# Patient Record
Sex: Female | Born: 1960
Health system: Southern US, Community
[De-identification: ages and names within clinical notes are randomized; demographics above are authoritative.]

## PROBLEM LIST (undated history)

## (undated) DIAGNOSIS — E785 Hyperlipidemia, unspecified: Secondary | ICD-10-CM

## (undated) DIAGNOSIS — F32A Depression, unspecified: Secondary | ICD-10-CM

## (undated) DIAGNOSIS — R011 Cardiac murmur, unspecified: Secondary | ICD-10-CM

## (undated) DIAGNOSIS — K8020 Calculus of gallbladder without cholecystitis without obstruction: Secondary | ICD-10-CM

## (undated) DIAGNOSIS — F329 Major depressive disorder, single episode, unspecified: Secondary | ICD-10-CM

## (undated) DIAGNOSIS — F419 Anxiety disorder, unspecified: Secondary | ICD-10-CM

## (undated) DIAGNOSIS — E559 Vitamin D deficiency, unspecified: Secondary | ICD-10-CM

## (undated) HISTORY — DX: Anxiety disorder, unspecified: F41.9

## (undated) HISTORY — DX: Hyperlipidemia, unspecified: E78.5

## (undated) HISTORY — DX: Vitamin D deficiency, unspecified: E55.9

## (undated) HISTORY — DX: Cardiac murmur, unspecified: R01.1

## (undated) HISTORY — DX: Calculus of gallbladder without cholecystitis without obstruction: K80.20

## (undated) HISTORY — DX: Major depressive disorder, single episode, unspecified: F32.9

## (undated) HISTORY — PX: OTHER SURGICAL HISTORY: SHX169

## (undated) HISTORY — DX: Depression, unspecified: F32.A

---

## 1998-01-13 ENCOUNTER — Other Ambulatory Visit: Admission: RE | Admit: 1998-01-13 | Discharge: 1998-01-13 | Payer: Self-pay | Admitting: *Deleted

## 1999-02-01 ENCOUNTER — Other Ambulatory Visit: Admission: RE | Admit: 1999-02-01 | Discharge: 1999-02-01 | Payer: Self-pay | Admitting: *Deleted

## 2000-02-22 ENCOUNTER — Other Ambulatory Visit: Admission: RE | Admit: 2000-02-22 | Discharge: 2000-02-22 | Payer: Self-pay | Admitting: *Deleted

## 2001-02-25 ENCOUNTER — Other Ambulatory Visit: Admission: RE | Admit: 2001-02-25 | Discharge: 2001-02-25 | Payer: Self-pay | Admitting: *Deleted

## 2001-12-31 ENCOUNTER — Encounter: Payer: Self-pay | Admitting: General Surgery

## 2001-12-31 ENCOUNTER — Inpatient Hospital Stay (HOSPITAL_COMMUNITY): Admission: AC | Admit: 2001-12-31 | Discharge: 2002-01-02 | Payer: Self-pay

## 2002-01-01 ENCOUNTER — Encounter: Payer: Self-pay | Admitting: General Surgery

## 2005-01-04 ENCOUNTER — Emergency Department (HOSPITAL_COMMUNITY): Admission: EM | Admit: 2005-01-04 | Discharge: 2005-01-05 | Payer: Self-pay | Admitting: Emergency Medicine

## 2005-04-17 ENCOUNTER — Other Ambulatory Visit: Admission: RE | Admit: 2005-04-17 | Discharge: 2005-04-17 | Payer: Self-pay | Admitting: *Deleted

## 2005-08-07 ENCOUNTER — Emergency Department (HOSPITAL_COMMUNITY): Admission: EM | Admit: 2005-08-07 | Discharge: 2005-08-08 | Payer: Self-pay | Admitting: Emergency Medicine

## 2006-04-09 ENCOUNTER — Other Ambulatory Visit: Admission: RE | Admit: 2006-04-09 | Discharge: 2006-04-09 | Payer: Self-pay | Admitting: *Deleted

## 2007-07-01 ENCOUNTER — Other Ambulatory Visit: Admission: RE | Admit: 2007-07-01 | Discharge: 2007-07-01 | Payer: Self-pay | Admitting: *Deleted

## 2007-07-08 ENCOUNTER — Encounter: Admission: RE | Admit: 2007-07-08 | Discharge: 2007-07-08 | Payer: Self-pay | Admitting: *Deleted

## 2008-08-19 ENCOUNTER — Other Ambulatory Visit: Admission: RE | Admit: 2008-08-19 | Discharge: 2008-08-19 | Payer: Self-pay | Admitting: Internal Medicine

## 2009-12-15 ENCOUNTER — Ambulatory Visit (HOSPITAL_COMMUNITY): Admission: RE | Admit: 2009-12-15 | Discharge: 2009-12-15 | Payer: Self-pay | Admitting: Internal Medicine

## 2012-01-29 ENCOUNTER — Other Ambulatory Visit (HOSPITAL_COMMUNITY)
Admission: RE | Admit: 2012-01-29 | Discharge: 2012-01-29 | Disposition: A | Discharge status: Home / Self Care | Payer: BC Managed Care – PPO | Source: Ambulatory Visit | Attending: Internal Medicine | Admitting: Internal Medicine

## 2012-01-29 DIAGNOSIS — Z01419 Encounter for gynecological examination (general) (routine) without abnormal findings: Secondary | ICD-10-CM | POA: Insufficient documentation

## 2012-04-25 ENCOUNTER — Other Ambulatory Visit: Payer: Self-pay | Admitting: Internal Medicine

## 2012-04-25 DIAGNOSIS — Z1231 Encounter for screening mammogram for malignant neoplasm of breast: Secondary | ICD-10-CM

## 2012-05-22 ENCOUNTER — Ambulatory Visit
Admission: RE | Admit: 2012-05-22 | Discharge: 2012-05-22 | Disposition: A | Discharge status: Home / Self Care | Payer: BC Managed Care – PPO | Source: Ambulatory Visit | Attending: Internal Medicine | Admitting: Internal Medicine

## 2012-05-22 DIAGNOSIS — Z1231 Encounter for screening mammogram for malignant neoplasm of breast: Secondary | ICD-10-CM

## 2012-05-22 LAB — HM MAMMOGRAPHY: HM Mammogram: NEGATIVE

## 2012-11-05 ENCOUNTER — Encounter: Payer: Self-pay | Admitting: Internal Medicine

## 2012-11-15 ENCOUNTER — Ambulatory Visit (AMBULATORY_SURGERY_CENTER): Payer: BC Managed Care – PPO | Admitting: *Deleted

## 2012-11-15 VITALS — Ht 62.0 in | Wt 115.6 lb

## 2012-11-15 DIAGNOSIS — Z1211 Encounter for screening for malignant neoplasm of colon: Secondary | ICD-10-CM

## 2012-11-15 MED ORDER — MOVIPREP 100 G PO SOLR: ORAL | Status: DC

## 2012-11-18 ENCOUNTER — Encounter: Payer: Self-pay | Admitting: Internal Medicine

## 2012-11-26 ENCOUNTER — Encounter: Payer: BC Managed Care – PPO | Admitting: Internal Medicine

## 2012-12-24 ENCOUNTER — Encounter: Payer: Self-pay | Admitting: Internal Medicine

## 2012-12-24 ENCOUNTER — Ambulatory Visit (AMBULATORY_SURGERY_CENTER): Payer: BC Managed Care – PPO | Admitting: Internal Medicine

## 2012-12-24 VITALS — BP 122/47 | HR 72 | Temp 98.4°F | Resp 18

## 2012-12-24 DIAGNOSIS — D126 Benign neoplasm of colon, unspecified: Secondary | ICD-10-CM

## 2012-12-24 DIAGNOSIS — Z1211 Encounter for screening for malignant neoplasm of colon: Secondary | ICD-10-CM

## 2012-12-24 MED ORDER — SODIUM CHLORIDE 0.9 % IV SOLN: 500.0000 mL | INTRAVENOUS | Status: DC

## 2012-12-24 NOTE — Progress Notes (Signed)
Lidocaine-40mg IV prior to Propofol InductionPropofol given over incremental dosages 

## 2012-12-24 NOTE — Op Note (Signed)
[facility] [address] [place], [address]   COLONOSCOPY PROCEDURE REPORT  PATIENT: Kayla Ware, Kayla Ware  MR#: [id number] BIRTHDATE: January 20, 1961 , 52  yrs. old GENDER: Female ENDOSCOPIST: Roxy Cedar, MD REFERRED MW:[id number] Oneta Rack, M.D. PROCEDURE DATE:  12/24/2012 PROCEDURE:   Colonoscopy with snare polypectomy   x 1 ASA CLASS:   Class II INDICATIONS:average risk screening. MEDICATIONS: MAC sedation, administered by CRNA and propofol (Diprivan) 200mg  IV  DESCRIPTION OF PROCEDURE:   After the risks benefits and alternatives of the procedure were thoroughly explained, informed consent was obtained.  A digital rectal exam revealed no abnormalities of the rectum.   The LB [id number]  endoscope was introduced through the anus and advanced to the cecum, which was identified by both the appendix and ileocecal valve. No adverse events experienced.   The quality of the prep was excellent, using MoviPrep  The instrument was then slowly withdrawn as the colon was fully examined.      COLON FINDINGS: A diminutive polyp was found in the transverse colon.  A polypectomy was performed with a cold snare.  The resection was complete and the polyp tissue was completely retrieved.   The colon was otherwise normal.  There was no diverticulosis, inflammation, other polyps or cancers.  Retroflexed views revealed no abnormalities. The time to cecum=2 minutes 26 seconds.  Withdrawal time=10 minutes 07 seconds.  The scope was withdrawn and the procedure completed. COMPLICATIONS: There were no complications.  ENDOSCOPIC IMPRESSION: 1.   Diminutive polyp was found in the transverse colon; polypectomy was performed with a cold snare 2.   The colon was otherwise normal  RECOMMENDATIONS: 1. Repeat colonoscopy in 5 years if polyp adenomatous; otherwise 10 years   eSigned:  Roxy Cedar, MD 12/24/2012 8:44 AM   cc: Lucky Cowboy, MD and The Patient

## 2012-12-24 NOTE — Progress Notes (Signed)
Patient did not have preoperative order for IV antibiotic SSI prophylaxis. (G8918)  Patient did not experience any of the following events: a burn prior to discharge; a fall within the facility; wrong site/side/patient/procedure/implant event; or a hospital transfer or hospital admission upon discharge from the facility. (G8907)  

## 2012-12-24 NOTE — Progress Notes (Signed)
Called to room to assist during endoscopic procedure.  Patient ID and intended procedure confirmed with present staff. Received instructions for my participation in the procedure from the performing physician.  

## 2012-12-24 NOTE — Patient Instructions (Addendum)
YOU HAD AN ENDOSCOPIC PROCEDURE TODAY AT THE [facility]: Refer to the procedure report that was given to you for any specific questions about what was found during the examination.  If the procedure report does not answer your questions, please call your gastroenterologist to clarify.  If you requested that your care partner not be given the details of your procedure findings, then the procedure report has been included in a sealed envelope for you to review at your convenience later.  YOU SHOULD EXPECT: Some feelings of bloating in the abdomen. Passage of more gas than usual.  Walking can help get rid of the air that was put into your GI tract during the procedure and reduce the bloating. If you had a lower endoscopy (such as a colonoscopy or flexible sigmoidoscopy) you may notice spotting of blood in your stool or on the toilet paper. If you underwent a bowel prep for your procedure, then you may not have a normal bowel movement for a few days.  DIET: Your first meal following the procedure should be a light meal and then it is ok to progress to your normal diet.  A half-sandwich or bowl of soup is an example of a good first meal.  Heavy or fried foods are harder to digest and may make you feel nauseous or bloated.  Likewise meals heavy in dairy and vegetables can cause extra gas to form and this can also increase the bloating.  Drink plenty of fluids but you should avoid alcoholic beverages for 24 hours.  ACTIVITY: Your care partner should take you home directly after the procedure.  You should plan to take it easy, moving slowly for the rest of the day.  You can resume normal activity the day after the procedure however you should NOT DRIVE or use heavy machinery for 24 hours (because of the sedation medicines used during the test).    SYMPTOMS TO REPORT IMMEDIATELY: A gastroenterologist can be reached at any hour.  During normal business hours, 8:30 AM to 5:00 PM Monday through Friday,  call [phone].  After hours and on weekends, please call the GI answering service at [phone] who will take a message and have the physician on call contact you.   Following lower endoscopy (colonoscopy or flexible sigmoidoscopy):  Excessive amounts of blood in the stool  Significant tenderness or worsening of abdominal pains  Swelling of the abdomen that is new, acute  Fever of 100F or higher  FOLLOW UP: If any biopsies were taken you will be contacted by phone or by letter within the next 1-3 weeks.  Call your gastroenterologist if you have not heard about the biopsies in 3 weeks.  Our staff will call the home number listed on your records the next business day following your procedure to check on you and address any questions or concerns that you may have at that time regarding the information given to you following your procedure. This is a courtesy call and so if there is no answer at the home number and we have not heard from you through the emergency physician on call, we will assume that you have returned to your regular daily activities without incident.  SIGNATURES/CONFIDENTIALITY: You and/or your care partner have signed paperwork which will be entered into your electronic medical record.  These signatures attest to the fact that that the information above on your After Visit Summary has been reviewed and is understood.  Full responsibility of the confidentiality of this   discharge information lies with you and/or your care-partner.  Repeat colonoscopy in 5 years if polyp adenomatous; otherwise 10 years 

## 2012-12-25 ENCOUNTER — Telehealth: Payer: Self-pay | Admitting: *Deleted

## 2012-12-25 NOTE — Telephone Encounter (Signed)
  Follow up Call-  Call back number 12/24/2012  Post procedure Call Back phone  # [phone]  Permission to leave phone message Yes     Patient questions:  Do you have a fever, pain , or abdominal swelling? no Pain Score  0 *  Have you tolerated food without any problems? yes  Have you been able to return to your normal activities? yes  Do you have any questions about your discharge instructions: Diet   no Medications  no Follow up visit  no  Do you have questions or concerns about your Care? no  Actions: * If pain score is 4 or above: No action needed, pain <4.   Follow up Call-  Call back number 12/24/2012  Post procedure Call Back phone  # [phone]  Permission to leave phone message Yes     Patient questions:  Do you have a fever, pain , or abdominal swelling? no Pain Score  0 *  Have you tolerated food without any problems? yes  Have you been able to return to your normal activities? yes  Do you have any questions about your discharge instructions: Diet   no Medications  no Follow up visit  no  Do you have questions or concerns about your Care? no  Actions: * If pain score is 4 or above: No action needed, pain <4.

## 2012-12-25 NOTE — Telephone Encounter (Signed)
No note to be entered 

## 2012-12-30 ENCOUNTER — Encounter: Payer: Self-pay | Admitting: Internal Medicine

## 2013-03-03 ENCOUNTER — Other Ambulatory Visit (HOSPITAL_COMMUNITY): Payer: Self-pay | Admitting: Internal Medicine

## 2013-03-03 ENCOUNTER — Ambulatory Visit (HOSPITAL_COMMUNITY)
Admission: RE | Admit: 2013-03-03 | Discharge: 2013-03-03 | Disposition: A | Discharge status: Home / Self Care | Payer: BC Managed Care – PPO | Source: Ambulatory Visit | Attending: Internal Medicine | Admitting: Internal Medicine

## 2013-03-03 DIAGNOSIS — R0602 Shortness of breath: Secondary | ICD-10-CM | POA: Insufficient documentation

## 2013-03-03 DIAGNOSIS — R911 Solitary pulmonary nodule: Secondary | ICD-10-CM | POA: Insufficient documentation

## 2013-03-03 DIAGNOSIS — R079 Chest pain, unspecified: Secondary | ICD-10-CM

## 2013-03-05 ENCOUNTER — Other Ambulatory Visit: Payer: Self-pay | Admitting: Internal Medicine

## 2013-03-05 DIAGNOSIS — R911 Solitary pulmonary nodule: Secondary | ICD-10-CM

## 2013-03-06 LAB — HM PAP SMEAR

## 2013-03-07 ENCOUNTER — Ambulatory Visit
Admission: RE | Admit: 2013-03-07 | Discharge: 2013-03-07 | Disposition: A | Discharge status: Home / Self Care | Payer: BC Managed Care – PPO | Source: Ambulatory Visit | Attending: Internal Medicine | Admitting: Internal Medicine

## 2013-03-07 DIAGNOSIS — R911 Solitary pulmonary nodule: Secondary | ICD-10-CM

## 2013-03-10 ENCOUNTER — Other Ambulatory Visit: Payer: BC Managed Care – PPO

## 2013-03-25 ENCOUNTER — Other Ambulatory Visit: Payer: Self-pay | Admitting: Internal Medicine

## 2013-03-25 DIAGNOSIS — R109 Unspecified abdominal pain: Secondary | ICD-10-CM

## 2013-03-27 ENCOUNTER — Ambulatory Visit
Admission: RE | Admit: 2013-03-27 | Discharge: 2013-03-27 | Disposition: A | Discharge status: Home / Self Care | Payer: BC Managed Care – PPO | Source: Ambulatory Visit | Attending: Internal Medicine | Admitting: Internal Medicine

## 2013-03-27 DIAGNOSIS — R109 Unspecified abdominal pain: Secondary | ICD-10-CM

## 2013-04-01 ENCOUNTER — Other Ambulatory Visit (HOSPITAL_COMMUNITY): Payer: Self-pay | Admitting: Internal Medicine

## 2013-04-01 DIAGNOSIS — R110 Nausea: Secondary | ICD-10-CM

## 2013-04-01 DIAGNOSIS — R1011 Right upper quadrant pain: Secondary | ICD-10-CM

## 2013-04-01 DIAGNOSIS — K8020 Calculus of gallbladder without cholecystitis without obstruction: Secondary | ICD-10-CM

## 2013-04-04 ENCOUNTER — Encounter (HOSPITAL_COMMUNITY)
Admission: RE | Admit: 2013-04-04 | Discharge: 2013-04-04 | Disposition: A | Discharge status: Home / Self Care | Payer: BC Managed Care – PPO | Source: Ambulatory Visit | Attending: Internal Medicine | Admitting: Internal Medicine

## 2013-04-04 DIAGNOSIS — R1011 Right upper quadrant pain: Secondary | ICD-10-CM

## 2013-04-04 DIAGNOSIS — K8020 Calculus of gallbladder without cholecystitis without obstruction: Secondary | ICD-10-CM

## 2013-04-04 DIAGNOSIS — R110 Nausea: Secondary | ICD-10-CM

## 2013-04-04 MED ORDER — TECHNETIUM TC 99M MEBROFENIN IV KIT
5.0000 | PACK | Freq: Once | INTRAVENOUS | Status: AC | PRN
  Administered 2013-04-04: 5 via INTRAVENOUS

## 2013-05-12 ENCOUNTER — Other Ambulatory Visit: Payer: Self-pay

## 2013-05-12 DIAGNOSIS — Z1231 Encounter for screening mammogram for malignant neoplasm of breast: Secondary | ICD-10-CM

## 2013-06-01 ENCOUNTER — Encounter: Payer: Self-pay | Admitting: Internal Medicine

## 2013-06-01 DIAGNOSIS — E559 Vitamin D deficiency, unspecified: Secondary | ICD-10-CM | POA: Insufficient documentation

## 2013-06-01 DIAGNOSIS — F419 Anxiety disorder, unspecified: Secondary | ICD-10-CM | POA: Insufficient documentation

## 2013-06-01 DIAGNOSIS — E785 Hyperlipidemia, unspecified: Secondary | ICD-10-CM | POA: Insufficient documentation

## 2013-06-02 ENCOUNTER — Ambulatory Visit: Payer: BC Managed Care – PPO | Admitting: Emergency Medicine

## 2013-06-02 ENCOUNTER — Encounter: Payer: Self-pay | Admitting: Emergency Medicine

## 2013-06-02 VITALS — BP 94/62 | HR 82 | Temp 98.6°F | Resp 18 | Wt 110.0 lb

## 2013-06-02 DIAGNOSIS — E782 Mixed hyperlipidemia: Secondary | ICD-10-CM

## 2013-06-02 DIAGNOSIS — F411 Generalized anxiety disorder: Secondary | ICD-10-CM

## 2013-06-02 DIAGNOSIS — R5381 Other malaise: Secondary | ICD-10-CM

## 2013-06-02 DIAGNOSIS — E559 Vitamin D deficiency, unspecified: Secondary | ICD-10-CM

## 2013-06-02 DIAGNOSIS — J019 Acute sinusitis, unspecified: Secondary | ICD-10-CM

## 2013-06-02 LAB — BASIC METABOLIC PANEL WITH GFR
CO2: 26 mEq/L (ref 19–32)
Calcium: 9.2 mg/dL (ref 8.4–10.5)
GFR, Est African American: >89 mL/min
GFR, Est Non African American: >89 mL/min
Sodium: 138 mEq/L (ref 135–145)

## 2013-06-02 LAB — LIPID PANEL
Cholesterol: 198 mg/dL (ref 0–200)
HDL: 58 mg/dL (ref >39)
LDL Cholesterol: 118 mg/dL — ABNORMAL HIGH (ref 0–99)
Total CHOL/HDL Ratio: 3.4 Ratio
Triglycerides: 108 mg/dL (ref <150)
VLDL: 22 mg/dL (ref 0–40)

## 2013-06-02 LAB — CBC WITH DIFFERENTIAL/PLATELET
Basophils Absolute: 0 10*3/uL (ref 0.0–0.1)
Basophils Relative: 0 % (ref 0–1)
Eosinophils Absolute: 0 10*3/uL (ref 0.0–0.7)
Lymphs Abs: 1.3 10*3/uL (ref 0.7–4.0)
MCH: 30.6 pg (ref 26.0–34.0)
MCHC: 34.2 g/dL (ref 30.0–36.0)
Neutrophils Relative %: 73 % (ref 43–77)
Platelets: 210 10*3/uL (ref 150–400)
RBC: 4.71 MIL/uL (ref 3.87–5.11)
RDW: 13.9 % (ref 11.5–15.5)
WBC: 8 10*3/uL (ref 4.0–10.5)

## 2013-06-02 LAB — HEPATIC FUNCTION PANEL
ALT: <8 U/L (ref 0–35)
Bilirubin, Direct: 0.1 mg/dL (ref 0.0–0.3)
Total Bilirubin: 0.7 mg/dL (ref 0.3–1.2)
Total Protein: 7 g/dL (ref 6.0–8.3)

## 2013-06-02 MED ORDER — AMOXICILLIN 500 MG PO TABS: 500.0000 mg | ORAL_TABLET | Freq: Three times a day (TID) | ORAL | Status: DC

## 2013-06-02 MED ORDER — FEXOFENADINE HCL 180 MG PO TABS: 180.0000 mg | ORAL_TABLET | Freq: Every day | ORAL | Status: DC

## 2013-06-02 MED ORDER — ALPRAZOLAM 0.5 MG PO TABS: 0.5000 mg | ORAL_TABLET | Freq: Two times a day (BID) | ORAL | Status: DC

## 2013-06-02 MED ORDER — BENZONATATE 100 MG PO CAPS: 100.0000 mg | ORAL_CAPSULE | Freq: Three times a day (TID) | ORAL | Status: DC | PRN

## 2013-06-02 NOTE — Progress Notes (Addendum)
  Subjective:    Patient ID: Kayla Ware, female    DOB: 10/09/1960, 52 y.o.   MRN: [id number]  HPI Comments: 52 yo female presents for 3 month F/U for  Cholesterol, D. Deficient. She has been trying to improve diet and exercises with work/ yard. She prefers no Cholesterol RX. LAST LABS T 206 TG 137 H 55 LDL 124 She has had sinus congestion over a week now with brown production from nose and occasional cough. + increase with fatigue and feverish since symptoms started. No relief with Tylenol or mucinex.   Hyperlipidemia  Anxiety    Cough Associated symptoms include a fever and postnasal drip.  Sore Throat  Associated symptoms include coughing.    Current Outpatient Prescriptions on File Prior to Visit  Medication Sig Dispense Refill  . B Complex-C (SUPER B COMPLEX PO) Take by mouth daily.      . Cholecalciferol (VITAMIN D3) 3000 UNITS TABS Take by mouth daily.      . clobetasol cream (TEMOVATE) 0.05 % Apply 1 application topically 2 (two) times daily.      . Flaxseed, Linseed, (FLAXSEED OIL PO) Take by mouth daily.      . Red Yeast Rice 600 MG CAPS Take 1,200 mg by mouth.       No current facility-administered medications on file prior to visit.   ALLERGIES Citalopram; Erythromycin; Meloxicam; and Propofol  Past Medical History  Diagnosis Date  . Anxiety   . Heart murmur   . Hyperlipidemia   . Depression   . Gall stones   . Unspecified vitamin D deficiency      Review of Systems  Constitutional: Positive for fever and fatigue.  HENT: Positive for postnasal drip and sinus pressure.   Respiratory: Positive for cough.   All other systems reviewed and are negative.   BP 94/62  Pulse 82  Temp(Src) 98.6 F (37 C) (Oral)  Resp 18  Wt 110 lb (49.896 kg)     Objective:   Physical Exam  Nursing note and vitals reviewed. Constitutional: She appears well-developed and well-nourished.  HENT:  Head: Normocephalic and atraumatic.  Right Ear: External ear normal.   Left Ear: External ear normal.  Nose: Nose normal.  Post pharynx erythema, yellow exudate + maxillary/ frontal tenderness  Eyes: Pupils are equal, round, and reactive to light.  Neck: Normal range of motion. Neck supple.  Cardiovascular: Normal rate, regular rhythm, normal heart sounds and intact distal pulses.   Pulmonary/Chest: Effort normal and breath sounds normal.  Abdominal: Soft.  Musculoskeletal: Normal range of motion.  Lymphadenopathy:    She has cervical adenopathy.  Neurological: She is alert.  Skin: Skin is warm and dry.  Psychiatric: She has a normal mood and affect. Judgment normal.          Assessment & Plan:  1. 3 month F/U for Cholesterol,  D. Deficient check labs 2. Sinusitis- Amox 500 mg AD, Tessalon Perles 100 mg AD, Allegra 180 mg AD, increase H2O w/c if symptoms increase

## 2013-06-02 NOTE — Patient Instructions (Signed)
Fat and Cholesterol Control Diet Fat and cholesterol levels in your blood and organs are influenced by your diet. High levels of fat and cholesterol may lead to diseases of the heart, small and large blood vessels, gallbladder, liver, and pancreas. CONTROLLING FAT AND CHOLESTEROL WITH DIET Although exercise and lifestyle factors are important, your diet is key. That is because certain foods are known to raise cholesterol and others to lower it. The goal is to balance foods for their effect on cholesterol and more importantly, to replace saturated and trans fat with other types of fat, such as monounsaturated fat, polyunsaturated fat, and omega-3 fatty acids. On average, a person should consume no more than 15 to 17 g of saturated fat daily. Saturated and trans fats are considered "bad" fats, and they will raise LDL cholesterol. Saturated fats are primarily found in animal products such as meats, butter, and cream. However, that does not mean you need to give up all your favorite foods. Today, there are good tasting, low-fat, low-cholesterol substitutes for most of the things you like to eat. Choose low-fat or nonfat alternatives. Choose round or loin cuts of red meat. These types of cuts are lowest in fat and cholesterol. Chicken (without the skin), fish, veal, and ground turkey breast are great choices. Eliminate fatty meats, such as hot dogs and salami. Even shellfish have little or no saturated fat. Have a 3 oz (85 g) portion when you eat lean meat, poultry, or fish. Trans fats are also called "partially hydrogenated oils." They are oils that have been scientifically manipulated so that they are solid at room temperature resulting in a longer shelf life and improved taste and texture of foods in which they are added. Trans fats are found in stick margarine, some tub margarines, cookies, crackers, and baked goods.  When baking and cooking, oils are a great substitute for butter. The monounsaturated oils are  especially beneficial since it is believed they lower LDL and raise HDL. The oils you should avoid entirely are saturated tropical oils, such as coconut and palm.  Remember to eat a lot from food groups that are naturally free of saturated and trans fat, including fish, fruit, vegetables, beans, grains (barley, rice, couscous, bulgur wheat), and pasta (without cream sauces).  IDENTIFYING FOODS THAT LOWER FAT AND CHOLESTEROL  Soluble fiber may lower your cholesterol. This type of fiber is found in fruits such as apples, vegetables such as broccoli, potatoes, and carrots, legumes such as beans, peas, and lentils, and grains such as barley. Foods fortified with plant sterols (phytosterol) may also lower cholesterol. You should eat at least 2 g per day of these foods for a cholesterol lowering effect.  Read package labels to identify low-saturated fats, trans fat free, and low-fat foods at the supermarket. Select cheeses that have only 2 to 3 g saturated fat per ounce. Use a heart-healthy tub margarine that is free of trans fats or partially hydrogenated oil. When buying baked goods (cookies, crackers), avoid partially hydrogenated oils. Breads and muffins should be made from whole grains (whole-wheat or whole oat flour, instead of "flour" or "enriched flour"). Buy non-creamy canned soups with reduced salt and no added fats.  FOOD PREPARATION TECHNIQUES  Never deep-fry. If you must fry, either stir-fry, which uses very little fat, or use non-stick cooking sprays. When possible, broil, bake, or roast meats, and steam vegetables. Instead of putting butter or margarine on vegetables, use lemon and herbs, applesauce, and cinnamon (for squash and sweet potatoes). Use nonfat   yogurt, salsa, and low-fat dressings for salads.  LOW-SATURATED FAT / LOW-FAT FOOD SUBSTITUTES Meats / Saturated Fat (g)  Avoid: Steak, marbled (3 oz/85 g) / 11 g  Choose: Steak, lean (3 oz/85 g) / 4 g  Avoid: Hamburger (3 oz/85 g) / 7  g  Choose: Hamburger, lean (3 oz/85 g) / 5 g  Avoid: Ham (3 oz/85 g) / 6 g  Choose: Ham, lean cut (3 oz/85 g) / 2.4 g  Avoid: Chicken, with skin, dark meat (3 oz/85 g) / 4 g  Choose: Chicken, skin removed, dark meat (3 oz/85 g) / 2 g  Avoid: Chicken, with skin, light meat (3 oz/85 g) / 2.5 g  Choose: Chicken, skin removed, light meat (3 oz/85 g) / 1 g Dairy / Saturated Fat (g)  Avoid: Whole milk (1 cup) / 5 g  Choose: Low-fat milk, 2% (1 cup) / 3 g  Choose: Low-fat milk, 1% (1 cup) / 1.5 g  Choose: Skim milk (1 cup) / 0.3 g  Avoid: Hard cheese (1 oz/28 g) / 6 g  Choose: Skim milk cheese (1 oz/28 g) / 2 to 3 g  Avoid: Cottage cheese, 4% fat (1 cup) / 6.5 g  Choose: Low-fat cottage cheese, 1% fat (1 cup) / 1.5 g  Avoid: Ice cream (1 cup) / 9 g  Choose: Sherbet (1 cup) / 2.5 g  Choose: Nonfat frozen yogurt (1 cup) / 0.3 g  Choose: Frozen fruit bar / trace  Avoid: Whipped cream (1 tbs) / 3.5 g  Choose: Nondairy whipped topping (1 tbs) / 1 g Condiments / Saturated Fat (g)  Avoid: Mayonnaise (1 tbs) / 2 g  Choose: Low-fat mayonnaise (1 tbs) / 1 g  Avoid: Butter (1 tbs) / 7 g  Choose: Extra light margarine (1 tbs) / 1 g  Avoid: Coconut oil (1 tbs) / 11.8 g  Choose: Olive oil (1 tbs) / 1.8 g  Choose: Corn oil (1 tbs) / 1.7 g  Choose: Safflower oil (1 tbs) / 1.2 g  Choose: Sunflower oil (1 tbs) / 1.4 g  Choose: Soybean oil (1 tbs) / 2.4 g  Choose: Canola oil (1 tbs) / 1 g Document Released: 07/03/2005 Document Revised: 10/28/2012 Document Reviewed: 12/22/2010 [employer] Patient Information 2014 [place], [place]. Sinusitis Sinusitis is redness, soreness, and puffiness (inflammation) of the air pockets in the bones of your face (sinuses). The redness, soreness, and puffiness can cause air and mucus to get trapped in your sinuses. This can allow germs to grow and cause an infection.  HOME CARE   Drink enough fluids to keep your pee (urine) clear or pale  yellow.  Use a humidifier in your home.  Run a hot shower to create steam in the bathroom. Sit in the bathroom with the door closed. Breathe in the steam 3 4 times a day.  Put a warm, moist washcloth on your face 3 4 times a day, or as told by your doctor.  Use salt water sprays (saline sprays) to wet the thick fluid in your nose. This can help the sinuses drain.  Only take medicine as told by your doctor. GET HELP RIGHT AWAY IF:   Your pain gets worse.  You have very bad headaches.  You are sick to your stomach (nauseous).  You throw up (vomit).  You are very sleepy (drowsy) all the time.  Your face is puffy (swollen).  Your vision changes.  You have a stiff neck.  You have trouble breathing. MAKE  SURE YOU:   Understand these instructions.  Will watch your condition.  Will get help right away if you are not doing well or get worse. Document Released: 12/20/2007 Document Revised: 03/27/2012 Document Reviewed: 02/06/2012 [facility] Patient Information 2014 [place], [place].

## 2013-06-10 ENCOUNTER — Ambulatory Visit
Admission: RE | Admit: 2013-06-10 | Discharge: 2013-06-10 | Disposition: A | Discharge status: Home / Self Care | Payer: BC Managed Care – PPO | Source: Ambulatory Visit

## 2013-06-10 DIAGNOSIS — Z1231 Encounter for screening mammogram for malignant neoplasm of breast: Secondary | ICD-10-CM

## 2013-08-14 ENCOUNTER — Other Ambulatory Visit: Payer: Self-pay | Admitting: Emergency Medicine

## 2013-09-15 ENCOUNTER — Ambulatory Visit (INDEPENDENT_AMBULATORY_CARE_PROVIDER_SITE_OTHER): Payer: BC Managed Care – PPO | Admitting: Emergency Medicine

## 2013-09-15 ENCOUNTER — Encounter: Payer: Self-pay | Admitting: Emergency Medicine

## 2013-09-15 VITALS — BP 104/66 | HR 90 | Temp 98.0°F | Resp 16 | Ht 62.0 in | Wt 112.0 lb

## 2013-09-15 DIAGNOSIS — R232 Flushing: Secondary | ICD-10-CM

## 2013-09-15 DIAGNOSIS — M2669 Other specified disorders of temporomandibular joint: Secondary | ICD-10-CM

## 2013-09-15 DIAGNOSIS — R079 Chest pain, unspecified: Secondary | ICD-10-CM

## 2013-09-15 DIAGNOSIS — M26629 Arthralgia of temporomandibular joint, unspecified side: Secondary | ICD-10-CM

## 2013-09-15 DIAGNOSIS — N951 Menopausal and female climacteric states: Secondary | ICD-10-CM

## 2013-09-15 DIAGNOSIS — F411 Generalized anxiety disorder: Secondary | ICD-10-CM

## 2013-09-15 MED ORDER — CLONIDINE HCL 0.1 MG PO TABS: 0.1000 mg | ORAL_TABLET | Freq: Two times a day (BID) | ORAL | Status: DC

## 2013-09-15 NOTE — Progress Notes (Signed)
Subjective:    Patient ID: Kayla Ware, female    DOB: 03/27/1961, 53 y.o.   MRN: [id number]  HPI Comments: 53 yo female with CP x 3 days. She notes "heart was hurting" but today whole chest felt heavy. She has had increased stress with family and financial situation. She has been using 1/2 xanax to help with hot flashes and anxiety and notes mild relief. She still has some tightness now, but it has improved with rest. She denies radiation of pain and notes it feels tight "not painful". She has not taken an extra ASA  + Hot flashes for several months with multiple trials of medication HRT by GYN w/o relief. She notes they disturb sleep which often increases stress.   + ear pain x  weeks, + clenching of teeth with stress. She also notes mild allergy drainage increase. She has not tried any OTC except for occasional advil with mild relief.  Otalgia   Anxiety Symptoms include chest pain and nervous/anxious behavior.    Chest Pain      Medication List       This list is accurate as of: 09/15/13  3:34 PM.  Always use your most recent med list.               ALPRAZolam 0.5 MG tablet  Commonly known as:  XANAX  TAKE ONE TABLET BY MOUTH TWICE DAILY     fexofenadine 180 MG tablet  Commonly known as:  ALLEGRA  Take 1 tablet (180 mg total) by mouth daily.     FLAXSEED OIL PO  Take by mouth daily.     Red Yeast Rice 600 MG Caps  Take 1,200 mg by mouth.       Allergies  Allergen Reactions  . Citalopram Other (See Comments)    FATIGUE   . Erythromycin Nausea And Vomiting  . Meloxicam Other (See Comments)    GI UPSET  . Propofol Other (See Comments)    TINNITUS   Past Medical History  Diagnosis Date  . Anxiety   . Heart murmur   . Hyperlipidemia   . Depression   . Gall stones   . Unspecified vitamin D deficiency       Review of Systems  Constitutional: Positive for fatigue.  HENT: Positive for ear pain.   Cardiovascular: Positive for chest pain.   Endocrine: Positive for heat intolerance.  Psychiatric/Behavioral: The patient is nervous/anxious.   All other systems reviewed and are negative.   BP 104/66  Pulse 90  Temp(Src) 98 F (36.7 C) (Temporal)  Resp 16  Ht 5\' 2"  (1.575 m)  Wt 112 lb (50.803 kg)  BMI 20.48 kg/m2     Objective:   Physical Exam  Nursing note and vitals reviewed. Constitutional: She is oriented to person, place, and time. She appears well-developed and well-nourished. No distress.  HENT:  Head: Normocephalic and atraumatic.  Right Ear: External ear normal.  Left Ear: External ear normal.  Nose: Nose normal.  Mouth/Throat: Oropharynx is clear and moist. No oropharyngeal exudate.  Cloudy TM's bilaterally +clicking/ popping of Both TMJ with open/ close with mild tenderness  Eyes: Conjunctivae and EOM are normal.  Neck: Normal range of motion. Neck supple. No JVD present. No thyromegaly present.  Cardiovascular: Normal rate, regular rhythm, normal heart sounds and intact distal pulses.   Pulmonary/Chest: Effort normal and breath sounds normal.  Abdominal: Soft. Bowel sounds are normal. She exhibits no distension and no mass. There is no tenderness.  There is no rebound and no guarding.  Musculoskeletal: Normal range of motion. She exhibits no edema and no tenderness.  Lymphadenopathy:    She has no cervical adenopathy.  Neurological: She is alert and oriented to person, place, and time. No cranial nerve deficit.  Skin: Skin is warm and dry. No rash noted. No erythema. No pallor.  Psychiatric: She has a normal mood and affect. Her behavior is normal. Judgment and thought content normal.      EKG NSCSPT WNL    Assessment & Plan:  1. Chest pain vs panic attack- Declines labs and cardio referall at this time. Advised could be stress related but women atypical with CV disease will go to ER if symptoms increase.Advised counseling since declines RX for stress 2. Hot flashes- Clonidine .1 qd to BID start  1/2 qhs and may slowly titrate up PRN 3. Ear pain vs TMJ- ALlegra/ Flonase AD OTC, ADvised caution with anti-inflammatories with past problems with GI upset, Declines PRED, TMJ hygiene explained, w/c with results may need soft diet x 1 week and dental f/u

## 2013-09-15 NOTE — Patient Instructions (Signed)
Chest Pain (Nonspecific) Chest pain has many causes. Your pain could be caused by something serious, such as a heart attack or a blood clot in the lungs. It could also be caused by something less serious, such as a chest bruise or a virus. Follow up with your doctor. More lab tests or other studies may be needed to find the cause of your pain. Most of the time, nonspecific chest pain will improve within 2 to 3 days of rest and mild pain medicine. HOME CARE  For chest bruises, you may put ice on the sore area for 15-20 minutes, 03-04 times a day. Do this only if it makes you feel better.  Put ice in a plastic bag.  Place a towel between the skin and the bag.  Rest for the next 2 to 3 days.  Go back to work if the pain improves.  See your doctor if the pain lasts longer than 1 to 2 weeks.  Only take medicine as told by your doctor.  Quit smoking if you smoke. GET HELP RIGHT AWAY IF:   There is more pain or pain that spreads to the arm, neck, jaw, back, or belly (abdomen).  You have shortness of breath.  You cough more than usual or cough up blood.  You have very bad back or belly pain, feel sick to your stomach (nauseous), or throw up (vomit).  You have very bad weakness.  You pass out (faint).  You have a fever. Any of these problems may be serious and may be an emergency. Do not wait to see if the problems will go away. Get medical help right away. Call your local emergency services 911 in [place] Do not drive yourself to the hospital. MAKE SURE YOU:   Understand these instructions.  Will watch this condition.  Will get help right away if you or your child is not doing well or gets worse. Document Released: 12/20/2007 Document Revised: 09/25/2011 Document Reviewed: 12/20/2007 [facility] Patient Information 2014 [place], [place]. Temporomandibular Problems  Temporomandibular joint (TMJ) dysfunction means there are problems with the joint between your jaw and your skull. This is  a joint lined by cartilage like other joints in your body but also has a small disc in the joint which keeps the bones from rubbing on each other. These joints are like other joints and can get inflamed (sore) from arthritis and other problems. When this joint gets sore, it can cause headaches and pain in the jaw and the face. CAUSES  Usually the arthritic types of problems are caused by soreness in the joint. Soreness in the joint can also be caused by overuse. This may come from grinding your teeth. It may also come from mis-alignment in the joint. DIAGNOSIS Diagnosis of this condition can often be made by history and exam. Sometimes your caregiver may need X-rays or an MRI scan to determine the exact cause. It may be necessary to see your dentist to determine if your teeth and jaws are lined up correctly. TREATMENT  Most of the time this problem is not serious; however, sometimes it can persist (become chronic). When this happens medications that will cut down on inflammation (soreness) help. Sometimes a shot of cortisone into the joint will be helpful. If your teeth are not aligned it may help for your dentist to make a splint for your mouth that can help this problem. If no physical problems can be found, the problem may come from tension. If tension is found to  be the cause, biofeedback or relaxation techniques may be helpful. HOME CARE INSTRUCTIONS   Later in the day, applications of ice packs may be helpful. Ice can be used in a plastic bag with a towel around it to prevent frostbite to skin. This may be used about every 2 hours for 20 to 30 minutes, as needed while awake, or as directed by your caregiver.  Only take over-the-counter or prescription medicines for pain, discomfort, or fever as directed by your caregiver.  If physical therapy was prescribed, follow your caregiver's directions.  Wear mouth appliances as directed if they were given. Document Released: 03/28/2001 Document Revised:  09/25/2011 Document Reviewed: 07/05/2008 [facility] Patient Information 2014 [place], [place].

## 2013-09-22 NOTE — Addendum Note (Signed)
Addended by: Kelby Aline R on: 09/22/2013 08:53 AM   Modules accepted: Orders

## 2013-09-26 ENCOUNTER — Encounter: Payer: Self-pay | Admitting: Emergency Medicine

## 2013-09-26 ENCOUNTER — Other Ambulatory Visit: Payer: Self-pay | Admitting: Emergency Medicine

## 2013-09-26 MED ORDER — VENLAFAXINE HCL ER 75 MG PO CP24: 75.0000 mg | ORAL_CAPSULE | Freq: Every day | ORAL | Status: DC

## 2013-10-27 ENCOUNTER — Encounter: Payer: Self-pay | Admitting: Emergency Medicine

## 2013-10-27 ENCOUNTER — Ambulatory Visit (INDEPENDENT_AMBULATORY_CARE_PROVIDER_SITE_OTHER): Payer: BC Managed Care – PPO | Admitting: Emergency Medicine

## 2013-10-27 VITALS — BP 106/70 | HR 56 | Temp 97.8°F | Resp 16 | Ht 62.0 in | Wt 113.0 lb

## 2013-10-27 DIAGNOSIS — Z79899 Other long term (current) drug therapy: Secondary | ICD-10-CM

## 2013-10-27 DIAGNOSIS — R5381 Other malaise: Secondary | ICD-10-CM

## 2013-10-27 DIAGNOSIS — R5383 Other fatigue: Secondary | ICD-10-CM

## 2013-10-27 DIAGNOSIS — I10 Essential (primary) hypertension: Secondary | ICD-10-CM

## 2013-10-27 DIAGNOSIS — F411 Generalized anxiety disorder: Secondary | ICD-10-CM

## 2013-10-27 DIAGNOSIS — Z0000 Encounter for general adult medical examination without abnormal findings: Secondary | ICD-10-CM

## 2013-10-27 DIAGNOSIS — E559 Vitamin D deficiency, unspecified: Secondary | ICD-10-CM

## 2013-10-27 LAB — BASIC METABOLIC PANEL WITH GFR
BUN: 7 mg/dL (ref 6–23)
CO2: 26 mEq/L (ref 19–32)
CREATININE: 0.59 mg/dL (ref 0.50–1.10)
Calcium: 9.8 mg/dL (ref 8.4–10.5)
Chloride: 105 mEq/L (ref 96–112)
GFR, Est African American: >89 mL/min
Glucose, Bld: 100 mg/dL — ABNORMAL HIGH (ref 70–99)
Potassium: 4.3 mEq/L (ref 3.5–5.3)
Sodium: 140 mEq/L (ref 135–145)

## 2013-10-27 LAB — CBC WITH DIFFERENTIAL/PLATELET
Basophils Absolute: 0 10*3/uL (ref 0.0–0.1)
Basophils Relative: 0 % (ref 0–1)
Eosinophils Absolute: 0.1 10*3/uL (ref 0.0–0.7)
Eosinophils Relative: 2 % (ref 0–5)
HEMATOCRIT: 42.1 % (ref 36.0–46.0)
Hemoglobin: 14.2 g/dL (ref 12.0–15.0)
LYMPHS ABS: 1.8 10*3/uL (ref 0.7–4.0)
LYMPHS PCT: 35 % (ref 12–46)
MCH: 30 pg (ref 26.0–34.0)
MCHC: 33.7 g/dL (ref 30.0–36.0)
MCV: 89 fL (ref 78.0–100.0)
MONO ABS: 0.6 10*3/uL (ref 0.1–1.0)
MONOS PCT: 11 % (ref 3–12)
Neutro Abs: 2.6 10*3/uL (ref 1.7–7.7)
Neutrophils Relative %: 52 % (ref 43–77)
Platelets: 227 10*3/uL (ref 150–400)
RBC: 4.73 MIL/uL (ref 3.87–5.11)
RDW: 14.3 % (ref 11.5–15.5)
WBC: 5 10*3/uL (ref 4.0–10.5)

## 2013-10-27 LAB — HEPATIC FUNCTION PANEL
ALT: 9 U/L (ref 0–35)
AST: 19 U/L (ref 0–37)
Albumin: 4.4 g/dL (ref 3.5–5.2)
Alkaline Phosphatase: 102 U/L (ref 39–117)
BILIRUBIN DIRECT: 0.2 mg/dL (ref 0.0–0.3)
BILIRUBIN TOTAL: 0.9 mg/dL (ref 0.2–1.2)
Indirect Bilirubin: 0.7 mg/dL (ref 0.2–1.2)
Total Protein: 6.8 g/dL (ref 6.0–8.3)

## 2013-10-27 LAB — IRON AND TIBC
%SAT: 47 % (ref 20–55)
IRON: 158 ug/dL — AB (ref 42–145)
TIBC: 334 ug/dL (ref 250–470)
UIBC: 176 ug/dL (ref 125–400)

## 2013-10-27 LAB — HEMOGLOBIN A1C
Hgb A1c MFr Bld: 5.6 % (ref <5.7)
Mean Plasma Glucose: 114 mg/dL (ref <117)

## 2013-10-27 LAB — LIPID PANEL
Cholesterol: 197 mg/dL (ref 0–200)
HDL: 63 mg/dL (ref >39)
LDL Cholesterol: 119 mg/dL — ABNORMAL HIGH (ref 0–99)
TRIGLYCERIDES: 75 mg/dL (ref <150)
Total CHOL/HDL Ratio: 3.1 Ratio
VLDL: 15 mg/dL (ref 0–40)

## 2013-10-27 LAB — MAGNESIUM: MAGNESIUM: 2.2 mg/dL (ref 1.5–2.5)

## 2013-10-27 LAB — VITAMIN B12: VITAMIN B 12: 436 pg/mL (ref 211–911)

## 2013-10-27 LAB — TSH: TSH: 2.302 u[IU]/mL (ref 0.350–4.500)

## 2013-10-27 MED ORDER — ALPRAZOLAM 1 MG PO TABS: 1.0000 mg | ORAL_TABLET | Freq: Every evening | ORAL | Status: DC | PRN

## 2013-10-27 NOTE — Progress Notes (Signed)
Subjective:    Patient ID: Kayla Ware, female    DOB: 08-May-1961, 53 y.o.   MRN: [id number]  HPI Comments: 53 yo WF CPE with cholesterol and anxiety history. She notes she has 3 episodes in last 2 months were feels completely washed out without any other trigger. She notes weakness is most common symptom. She notes she feels like she is anemic or having panic attack when episode happens and occasionally feels throat is closing. She notes mild reflux occasionally.  Her last cycle was over 1 year ago. She still has hot flashes and bad anxiety. She notes she feels occasional head tightness when episodes occur. She has increased stress. She takes 1/2 xanax in a.m, 1/2 lunch/ 1/2 QHS and helps some and she wants to increase dose. She notes she is working a lot and has leg/ back pain with increased hours at work. She notes pain resolves with rest.  Last abnormal lab was LDL 118  She is not exercising routinely but stays active with multiple jobs. She eats descent but occasionally skips meals.   Anxiety Symptoms include nervous/anxious behavior.    Hyperlipidemia     Medication List       This list is accurate as of: 10/27/13 11:59 PM.  Always use your most recent med list.               ALPRAZolam 1 MG tablet  Commonly known as:  XANAX  Take 1 tablet (1 mg total) by mouth at bedtime as needed for sleep.     aspirin 81 MG tablet  Take 81 mg by mouth daily.     fexofenadine 180 MG tablet  Commonly known as:  ALLEGRA  Take 1 tablet (180 mg total) by mouth daily.     FLAXSEED OIL PO  Take by mouth daily.     Red Yeast Rice 600 MG Caps  Take 1,200 mg by mouth.     VITAMIN-B COMPLEX PO  Take by mouth daily.        Allergies  Allergen Reactions  . Citalopram Other (See Comments)    FATIGUE   . Erythromycin Nausea And Vomiting  . Meloxicam Other (See Comments)    GI UPSET  . Propofol Other (See Comments)    TINNITUS   Past Medical History  Diagnosis Date  .  Anxiety   . Heart murmur   . Hyperlipidemia   . Depression   . Gall stones   . Unspecified vitamin D deficiency    Past Surgical History  Procedure Laterality Date  . None     History  Substance Use Topics  . Smoking status: Never Smoker   . Smokeless tobacco: Never Used  . Alcohol Use: 3.6 oz/week    6 Cans of beer per week   Family History  Problem Relation Age of Onset  . Colon polyps Father   . Hyperlipidemia Father   . Depression Father   . Colon cancer Paternal Aunt 75  . Arthritis Brother     RHEUMATOID  . Cancer Maternal Aunt     breast, cervical  . Cancer Maternal Grandmother   . Birth defects Maternal Grandmother     breast  . Diabetes Paternal Grandmother    Patient Care Team: Unk Pinto, MD as PCP - General (Internal Medicine) Jerrell Belfast, MD as Consulting Physician (Otolaryngology) Cyril Mourning, MD as Consulting Physician (Obstetrics and Gynecology) Judson Roch, (Dentist) [facility] [address], (Eye)  MAINTENANCE: Colonoscopy: 12/24/12 Mammo:06/10/13 BMD:n/a Pap/  Pelvic:03/06/13 [id number] Dentist:q 6 month  IMMUNIZATIONS: Tdap: 2014 Pneumovax:n/a Zostavax:n/a Influenza:n/a    Review of Systems  Constitutional: Positive for fatigue.  HENT: Positive for trouble swallowing.   Neurological: Positive for headaches.  Psychiatric/Behavioral: The patient is nervous/anxious.   All other systems reviewed and are negative.  BP 106/70  Pulse 56  Temp(Src) 97.8 F (36.6 C) (Temporal)  Resp 16  Ht 5\' 2"  (1.575 m)  Wt 113 lb (51.256 kg)  BMI 20.66 kg/m2     Objective:   Physical Exam  Nursing note and vitals reviewed. Constitutional: She is oriented to person, place, and time. She appears well-developed and well-nourished. No distress.  HENT:  Head: Normocephalic and atraumatic.  Right Ear: External ear normal.  Left Ear: External ear normal.  Nose: Nose normal.  Mouth/Throat: Oropharynx is clear and moist. No oropharyngeal exudate.   Eyes: Conjunctivae and EOM are normal. Pupils are equal, round, and reactive to light. Right eye exhibits no discharge. Left eye exhibits no discharge. No scleral icterus.  Neck: Normal range of motion. Neck supple. No JVD present. No tracheal deviation present. No thyromegaly present.  Cardiovascular: Normal rate, regular rhythm, normal heart sounds and intact distal pulses.   Pulmonary/Chest: Effort normal and breath sounds normal.  Abdominal: Soft. Bowel sounds are normal. She exhibits no distension and no mass. There is no tenderness. There is no rebound and no guarding.  Genitourinary:  DEF GYN  Musculoskeletal: Normal range of motion. She exhibits no edema and no tenderness.  Lymphadenopathy:    She has no cervical adenopathy.  Neurological: She is alert and oriented to person, place, and time. She has normal reflexes. No cranial nerve deficit. She exhibits normal muscle tone. Coordination normal.  Skin: Skin is warm and dry. No rash noted. No erythema. No pallor.  Left low abdomen flat 2 mm dark  Psychiatric: She has a normal mood and affect. Her behavior is normal. Judgment and thought content normal.      AORTA SCAN WNL EKG NSCSPT     Assessment & Plan:  1. CPE- Update screening labs/ History/ Immunizations/ Testing as needed. Advised healthy diet, QD exercise, increase H20 and continue RX/ Vitamins AD.  2. ? Headache vs menopause vs chronic fatigue-? CT HEAD if no change- ? Tension HA vs Anxiety, advised decrease stress, check labs. Advised may increase Xanax to 1 mg 1/2 to 1 BID, w/c with results or if SX increase or ER.   3. ? GERD vs anxiety vs Allergic rhinitis- Allegra OTC, increase H2o, allergy hygiene explained.- Diet/ hygiene discussed, May try OTC Nexium/ Prilosec and call with results  4.  Irreg Nevi- monitor for any change, call if occurs for removal

## 2013-10-27 NOTE — Patient Instructions (Addendum)
Diet for Gastroesophageal Reflux Disease, Adult Reflux (acid reflux) is when acid from your stomach flows up into the esophagus. When acid comes in contact with the esophagus, the acid causes irritation and soreness (inflammation) in the esophagus. When reflux happens often or so severely that it causes damage to the esophagus, it is called gastroesophageal reflux disease (GERD). Nutrition therapy can help ease the discomfort of GERD. FOODS OR DRINKS TO AVOID OR LIMIT  Smoking or chewing tobacco. Nicotine is one of the most potent stimulants to acid production in the gastrointestinal tract.  Caffeinated and decaffeinated coffee and black tea.  Regular or low-calorie carbonated beverages or energy drinks (caffeine-free carbonated beverages are allowed).   Strong spices, such as black pepper, white pepper, red pepper, cayenne, curry powder, and chili powder.  Peppermint or spearmint.  Chocolate.  High-fat foods, including meats and fried foods. Extra added fats including oils, butter, salad dressings, and nuts. Limit these to less than 8 tsp per day.  Fruits and vegetables if they are not tolerated, such as citrus fruits or tomatoes.  Alcohol.  Any food that seems to aggravate your condition. If you have questions regarding your diet, call your caregiver or a registered dietitian. OTHER THINGS THAT MAY HELP GERD INCLUDE:   Eating your meals slowly, in a relaxed setting.  Eating 5 to 6 small meals per day instead of 3 large meals.  Eliminating food for a period of time if it causes distress.  Not lying down until 3 hours after eating a meal.  Keeping the head of your bed raised 6 to 9 inches (15 to 23 cm) by using a foam wedge or blocks under the legs of the bed. Lying flat may make symptoms worse.  Being physically active. Weight loss may be helpful in reducing reflux in overweight or obese adults.  Wear loose fitting clothing EXAMPLE MEAL PLAN This meal plan is approximately  2,000 calories based on [email or web address] meal planning guidelines. Breakfast   cup cooked oatmeal.  1 cup strawberries.  1 cup low-fat milk.  1 oz almonds. Snack  1 cup cucumber slices.  6 oz yogurt (made from low-fat or fat-free milk). Lunch  2 slice whole-wheat bread.  2 oz sliced [place].  2 tsp mayonnaise.  1 cup blueberries.  1 cup snap peas. Snack  6 whole-wheat crackers.  1 oz string cheese. Dinner   cup brown rice.  1 cup mixed veggies.  1 tsp olive oil.  3 oz grilled fish. Document Released: 07/03/2005 Document Revised: 09/25/2011 Document Reviewed: 05/19/2011 [facility] Patient Information 2014 [place], [place]. Tension Headache A tension headache is pain, pressure, or aching felt over the front and sides of the head. Tension headaches often come after stress, feeling worried (anxiety), or feeling sad or down for a while (depressed). HOME CARE  Only take medicine as told by your doctor.  Lie down in a dark, quiet room when you have a headache.  Keep a journal to find out if certain things bring on headaches. For example, write down:  What you eat and drink.  How much sleep you get.  Any change to your diet or medicines.  Relax by getting a massage or doing other relaxing activities.  Put ice or heat packs on the head and neck area as told by your doctor.  Lessen stress.  Sit up straight. Do not tighten (tense) your muscles.  Quit smoking if you smoke.  Lessen how much alcohol you drink.  Lessen how much caffeine you drink,  or stop drinking caffeine.  Eat and exercise regularly.  Get enough sleep.  Avoid using too much pain medicine. GET HELP RIGHT AWAY IF:   Your headache becomes really bad.  You have a fever.  You have a stiff neck.  You have trouble seeing.  Your muscles are weak, or you lose muscle control.  You lose your balance or have trouble walking.  You feel like you will pass out (faint), or you pass  out.  You have really bad symptoms that are different than your first symptoms.  You have problems with the medicines given to you by your doctor.  Your medicines do not work.  Your headache feels different than the other headaches.  You feel sick to your stomach (nauseous) or throw up (vomit). MAKE SURE YOU:   Understand these instructions.  Will watch your condition.  Will get help right away if you are not doing well or get worse. Document Released: 09/27/2009 Document Revised: 09/25/2011 Document Reviewed: 06/23/2011 [facility] Patient Information 2014 [place], [place].

## 2013-10-28 LAB — URINALYSIS, ROUTINE W REFLEX MICROSCOPIC
BILIRUBIN URINE: NEGATIVE
GLUCOSE, UA: NEGATIVE mg/dL
Hgb urine dipstick: NEGATIVE
Ketones, ur: NEGATIVE mg/dL
Leukocytes, UA: NEGATIVE
Nitrite: NEGATIVE
PROTEIN: NEGATIVE mg/dL
Specific Gravity, Urine: 1.01 (ref 1.005–1.030)
Urobilinogen, UA: 0.2 mg/dL (ref 0.0–1.0)
pH: 7 (ref 5.0–8.0)

## 2013-10-28 LAB — MICROALBUMIN / CREATININE URINE RATIO
Creatinine, Urine: 62.6 mg/dL
MICROALB UR: 0.5 mg/dL (ref 0.00–1.89)
MICROALB/CREAT RATIO: 8 mg/g (ref 0.0–30.0)

## 2013-10-28 LAB — FOLATE RBC: RBC Folate: 529 ng/mL (ref >280)

## 2013-10-28 LAB — INSULIN, FASTING: Insulin fasting, serum: 8 u[IU]/mL (ref 3–28)

## 2013-10-28 LAB — VITAMIN D 25 HYDROXY (VIT D DEFICIENCY, FRACTURES): Vit D, 25-Hydroxy: 35 ng/mL (ref 30–89)

## 2014-01-07 ENCOUNTER — Other Ambulatory Visit: Payer: Self-pay

## 2014-01-07 ENCOUNTER — Other Ambulatory Visit: Payer: Self-pay | Admitting: Emergency Medicine

## 2014-01-07 DIAGNOSIS — N390 Urinary tract infection, site not specified: Secondary | ICD-10-CM

## 2014-01-08 ENCOUNTER — Other Ambulatory Visit: Payer: Self-pay | Admitting: Emergency Medicine

## 2014-01-08 LAB — URINALYSIS, ROUTINE W REFLEX MICROSCOPIC
Bilirubin Urine: NEGATIVE
Glucose, UA: NEGATIVE mg/dL
Hgb urine dipstick: NEGATIVE
Leukocytes, UA: NEGATIVE
Nitrite: NEGATIVE
PH: 5 (ref 5.0–8.0)
Protein, ur: NEGATIVE mg/dL
SPECIFIC GRAVITY, URINE: 1.025 (ref 1.005–1.030)
Urobilinogen, UA: 0.2 mg/dL (ref 0.0–1.0)

## 2014-01-08 LAB — URINE CULTURE: Colony Count: 50000

## 2014-01-08 MED ORDER — ALPRAZOLAM 1 MG PO TABS: 1.0000 mg | ORAL_TABLET | Freq: Two times a day (BID) | ORAL | Status: DC | PRN

## 2014-03-02 ENCOUNTER — Ambulatory Visit: Payer: Self-pay | Admitting: Emergency Medicine

## 2014-03-16 ENCOUNTER — Ambulatory Visit (INDEPENDENT_AMBULATORY_CARE_PROVIDER_SITE_OTHER): Payer: BC Managed Care – PPO | Admitting: Emergency Medicine

## 2014-03-16 ENCOUNTER — Encounter: Payer: Self-pay | Admitting: Emergency Medicine

## 2014-03-16 VITALS — BP 122/80 | HR 62 | Temp 98.0°F | Resp 16 | Ht 62.0 in | Wt 114.0 lb

## 2014-03-16 DIAGNOSIS — R5383 Other fatigue: Secondary | ICD-10-CM

## 2014-03-16 DIAGNOSIS — R5381 Other malaise: Secondary | ICD-10-CM

## 2014-03-16 DIAGNOSIS — M2550 Pain in unspecified joint: Secondary | ICD-10-CM

## 2014-03-16 DIAGNOSIS — R7309 Other abnormal glucose: Secondary | ICD-10-CM

## 2014-03-16 DIAGNOSIS — R413 Other amnesia: Secondary | ICD-10-CM

## 2014-03-16 DIAGNOSIS — E782 Mixed hyperlipidemia: Secondary | ICD-10-CM

## 2014-03-16 LAB — CBC WITH DIFFERENTIAL/PLATELET
Basophils Absolute: 0 10*3/uL (ref 0.0–0.1)
Basophils Relative: 0 % (ref 0–1)
EOS ABS: 0.1 10*3/uL (ref 0.0–0.7)
EOS PCT: 2 % (ref 0–5)
HCT: 41 % (ref 36.0–46.0)
Hemoglobin: 13.5 g/dL (ref 12.0–15.0)
Lymphocytes Relative: 33 % (ref 12–46)
Lymphs Abs: 2.4 10*3/uL (ref 0.7–4.0)
MCH: 29.7 pg (ref 26.0–34.0)
MCHC: 32.9 g/dL (ref 30.0–36.0)
MCV: 90.3 fL (ref 78.0–100.0)
Monocytes Absolute: 0.6 10*3/uL (ref 0.1–1.0)
Monocytes Relative: 8 % (ref 3–12)
NEUTROS PCT: 57 % (ref 43–77)
Neutro Abs: 4.1 10*3/uL (ref 1.7–7.7)
PLATELETS: 214 10*3/uL (ref 150–400)
RBC: 4.54 MIL/uL (ref 3.87–5.11)
RDW: 14.1 % (ref 11.5–15.5)
WBC: 7.2 10*3/uL (ref 4.0–10.5)

## 2014-03-16 NOTE — Patient Instructions (Signed)
FYI  Dementia Dementia is a general term for problems with brain function. A person with dementia has memory loss and a hard time with at least one other brain function such as thinking, speaking, or problem solving. Dementia can affect social functioning, how you do your job, your mood, or your personality. The changes may be hidden for a long time. The earliest forms of this disease are usually not detected by family or friends. Dementia can be:  Irreversible.  Potentially reversible.  Partially reversible.  Progressive. This means it can get worse over time. CAUSES  Irreversible dementia causes may include:  Degeneration of brain cells (Alzheimer disease or Lewy body dementia).  Multiple small strokes (vascular dementia).  Infection (chronic meningitis or Creutzfeldt-Jakob disease).  Frontotemporal dementia. This affects younger people, age 26 to 82, compared to those who have Alzheimer disease.  Dementia associated with other disorders like Parkinson disease, Huntington disease, or HIV-associated dementia. Potentially or partially reversible dementia causes may include:  Medicines.  Metabolic causes such as excessive alcohol intake, vitamin B12 deficiency, or thyroid disease.  Masses or pressure in the brain such as a tumor, blood clot, or hydrocephalus. SIGNS AND SYMPTOMS  Symptoms are often hard to detect. Family members or coworkers may not notice them early in the disease process. Different people with dementia may have different symptoms. Symptoms can include:  A hard time with memory, especially recent memory. Long-term memory may not be impaired.  Asking the same question multiple times or forgetting something someone just said.  A hard time speaking your thoughts or finding certain words.  A hard time solving problems or performing familiar tasks (such as how to use a telephone).  Sudden changes in mood.  Changes in personality, especially increasing moodiness  or mistrust.  Depression.  A hard time understanding complex ideas that were never a problem in the past. DIAGNOSIS  There are no specific tests for dementia.   Your health care provider may recommend a thorough evaluation. This is because some forms of dementia can be reversible. The evaluation will likely include a physical exam and getting a detailed history from you and a family member. The history often gives the best clues and suggestions for a diagnosis.  Memory testing may be done. A detailed brain function evaluation called neuropsychologic testing may be helpful.  Lab tests and brain imaging (such as a CT scan or MRI scan) are sometimes important.  Sometimes observation and re-evaluation over time is very helpful. TREATMENT  Treatment depends on the cause.   If the problem is a vitamin deficiency, it may be helped or cured with supplements.  For dementias such as Alzheimer disease, medicines are available to stabilize or slow the course of the disease. There are no cures for this type of dementia.  Your health care provider can help direct you to groups, organizations, and other health care providers to help with decisions in the care of you or your loved one. HOME CARE INSTRUCTIONS The care of individuals with dementia is varied and dependent upon the progression of the dementia. The following suggestions are intended for the person living with, or caring for, the person with dementia.  Create a safe environment.  Remove the locks on bathroom doors to prevent the person from accidentally locking himself or herself in.  Use childproof latches on kitchen cabinets and any place where cleaning supplies, chemicals, or alcohol are kept.  Use childproof covers in unused electrical outlets.  Install childproof devices to keep doors  and windows secured.  Remove stove knobs or install safety knobs and an automatic shut-off on the stove.  Lower the temperature on water  heaters.  Label medicines and keep them locked up.  Secure knives, lighters, matches, power tools, and guns, and keep these items out of reach.  Keep the house free from clutter. Remove rugs or anything that might contribute to a fall.  Remove objects that might break and hurt the person.  Make sure lighting is good, both inside and outside.  Install grab rails as needed.  Use a monitoring device to alert you to falls or other needs for help.  Reduce confusion.  Keep familiar objects and people around.  Use night lights or dim lights at night.  Label items or areas.  Use reminders, notes, or directions for daily activities or tasks.  Keep a simple, consistent routine for waking, meals, bathing, dressing, and bedtime.  Create a calm, quiet environment.  Place large clocks and calendars prominently.  Display emergency numbers and home address near all telephones.  Use cues to establish different times of the day. An example is to open curtains to let the natural light in during the day.   Use effective communication.  Choose simple words and short sentences.  Use a gentle, calm tone of voice.  Be careful not to interrupt.  If the person is struggling to find a word or communicate a thought, try to provide the word or thought.  Ask one question at a time. Allow the person ample time to answer questions. Repeat the question again if the person does not respond.  Reduce nighttime restlessness.  Provide a comfortable bed.  Have a consistent nighttime routine.  Ensure a regular walking or physical activity schedule. Involve the person in daily activities as much as possible.  Limit napping during the day.  Limit caffeine.  Attend social events that stimulate rather than overwhelm the senses.  Encourage good nutrition and hydration.  Reduce distractions during meal times and snacks.  Avoid foods that are too hot or too cold.  Monitor chewing and swallowing  ability.  Continue with routine vision, hearing, dental, and medical screenings.  Give medicines only as directed by the health care provider.  Monitor driving abilities. Do not allow the person to drive when safe driving is no longer possible.  Register with an identification program which could provide location assistance in the event of a missing person situation. SEEK MEDICAL CARE IF:   New behavioral problems start such as moodiness, aggressiveness, or seeing things that are not there (hallucinations).  Any new problem with brain function happens. This includes problems with balance, speech, or falling a lot.  Problems with swallowing develop.  Any symptoms of other illness happen. Small changes or worsening in any aspect of brain function can be a sign that the illness is getting worse. It can also be a sign of another medical illness such as infection. Seeing a health care provider right away is important. SEEK IMMEDIATE MEDICAL CARE IF:   A fever develops.  New or worsened confusion develops.  New or worsened sleepiness develops.  Staying awake becomes hard to do. Document Released: 12/27/2000 Document Revised: 11/17/2013 Document Reviewed: 11/28/2010 [facility] Patient Information 2015 [place], [place]. This information is not intended to replace advice given to you by your health care provider. Make sure you discuss any questions you have with your health care provider.

## 2014-03-16 NOTE — Progress Notes (Signed)
Subjective:    Patient ID: Kayla Ware, female    DOB: 02-24-1961, 53 y.o.   MRN: [id number]  HPI Comments: 53 yo WF presents for 3 month F/U for Cholesterol, Pre-Dm, D. Deficient. She denies exercise outside of standing at work. She is eating healthy for most part.   She has noticed increased hand arthritis again, she had 1.5 years relief after injection by Ortho with neg RA/ autoimmune 2/14. She notes brother has + RA and is being treated.   She is also concerned with memory issues. She notes she is forgetting simple things more often. She had CT head 2007 with mild atrophy related to ETOH. She drinks 3-5 beers most nights.  WBC             5.0   10/27/2013 HGB            14.2   10/27/2013 HCT            42.1   10/27/2013 PLT             227   10/27/2013 GLUCOSE         100   10/27/2013 CHOL            197   4/13/2015ha TRIG             75   10/27/2013 HDL              63   10/27/2013 LDLCALC         119   10/27/2013 ALT               9   10/27/2013 AST              19   10/27/2013 NA              140   10/27/2013 K               4.3   10/27/2013 CL              105   10/27/2013 CREATININE     0.59   10/27/2013 BUN               7   10/27/2013 CO2              26   10/27/2013 TSH           2.302   10/27/2013 HGBA1C          5.6   10/27/2013 MICROALBUR     0.50   10/27/2013   Hyperlipidemia     Medication List       This list is accurate as of: 03/16/14  3:16 PM.  Always use your most recent med list.               ALPRAZolam 1 MG tablet  Commonly known as:  XANAX  Take 1 tablet (1 mg total) by mouth 2 (two) times daily as needed for sleep.     aspirin 81 MG tablet  Take 81 mg by mouth daily.     FLAXSEED OIL PO  Take by mouth daily.     Red Yeast Rice 600 MG Caps  Take 1,200 mg by mouth.     VITAMIN-B COMPLEX PO  Take by mouth daily.       Allergies  Allergen Reactions  . Citalopram Other (See Comments)    FATIGUE   . Erythromycin Nausea And Vomiting  .  Meloxicam Other (See Comments)    GI UPSET  . Propofol Other (See Comments)    TINNITUS   Past Medical History  Diagnosis Date  . Anxiety   . Heart murmur   . Hyperlipidemia   . Depression   . Gall stones   . Unspecified vitamin D deficiency    Past Surgical History  Procedure Laterality Date  . None      History  Substance Use Topics  . Smoking status: Never Smoker   . Smokeless tobacco: Never Used  . Alcohol Use: 3.6 oz/week    6 Cans of beer per week   Family History  Problem Relation Age of Onset  . Colon polyps Father   . Hyperlipidemia Father   . Depression Father   . Colon cancer Paternal Aunt 66  . Arthritis Brother     RHEUMATOID  . Cancer Maternal Aunt     breast, cervical  . Cancer Maternal Grandmother   . Birth defects Maternal Grandmother     breast  . Diabetes Paternal Grandmother    MAINTENANCE: Colonoscopy:12/24/12 Mammo:06/10/13 BMD:n/a Pap/ Pelvic:01/2012 MWU:[id number] Dentist:q 6 month  IMMUNIZATIONS: Tdap:10/28/12  Patient Care Team: Unk Pinto, MD as PCP - General (Internal Medicine) Jerrell Belfast, MD as Consulting Physician (Otolaryngology) Cyril Mourning, MD as Consulting Physician (Obstetrics and Gynecology)     Review of Systems  Musculoskeletal: Positive for arthralgias.  Psychiatric/Behavioral: Positive for confusion.  All other systems reviewed and are negative.  BP 122/80  Pulse 62  Temp(Src) 98 F (36.7 C) (Temporal)  Resp 16  Ht 5\' 2"  (1.575 m)  Wt 114 lb (51.71 kg)  BMI 20.85 kg/m2     Objective:   Physical Exam  Nursing note and vitals reviewed. Constitutional: She is oriented to person, place, and time. She appears well-developed and well-nourished. No distress.  HENT:  Head: Normocephalic and atraumatic.  Right Ear: External ear normal.  Left Ear: External ear normal.  Nose: Nose normal.  Mouth/Throat: Oropharynx is clear and moist.  Eyes: Conjunctivae and EOM are normal.  Neck: Normal range  of motion. Neck supple. No JVD present. No thyromegaly present.  Cardiovascular: Normal rate, regular rhythm, normal heart sounds and intact distal pulses.   Pulmonary/Chest: Effort normal and breath sounds normal.  Abdominal: Soft. Bowel sounds are normal. She exhibits no distension and no mass. There is no tenderness. There is no rebound and no guarding.  Musculoskeletal: Normal range of motion. She exhibits no edema and no tenderness.  Lymphadenopathy:    She has no cervical adenopathy.  Neurological: She is alert and oriented to person, place, and time. No cranial nerve deficit. Coordination normal.  Skin: Skin is warm and dry. No rash noted. No erythema. No pallor.  Psychiatric: She has a normal mood and affect. Her behavior is normal. Judgment and thought content normal.          Assessment & Plan:  1.  3 month F/U for Cholesterol, Pre-Dm, D. Deficient. Needs healthy diet, cardio QD and obtain healthy weight. Check Labs, Check BP if >130/80 call office   2. Arthralgia- ref Rheum with family history and increased pain  3. Memory change vs depression vs Fatigue- check labs, increase activity and H2O, MRI brain w, w/o to further evaluate. Advised decrease ETOH.

## 2014-03-17 LAB — HEPATIC FUNCTION PANEL
ALT: 10 U/L (ref 0–35)
AST: 16 U/L (ref 0–37)
Albumin: 4.4 g/dL (ref 3.5–5.2)
Alkaline Phosphatase: 109 U/L (ref 39–117)
BILIRUBIN DIRECT: 0.1 mg/dL (ref 0.0–0.3)
BILIRUBIN TOTAL: 0.5 mg/dL (ref 0.2–1.2)
Indirect Bilirubin: 0.4 mg/dL (ref 0.2–1.2)
Total Protein: 6.6 g/dL (ref 6.0–8.3)

## 2014-03-17 LAB — BASIC METABOLIC PANEL WITH GFR
BUN: 11 mg/dL (ref 6–23)
CALCIUM: 9.2 mg/dL (ref 8.4–10.5)
CO2: 28 mEq/L (ref 19–32)
Chloride: 106 mEq/L (ref 96–112)
Creat: 0.64 mg/dL (ref 0.50–1.10)
Glucose, Bld: 94 mg/dL (ref 70–99)
Potassium: 3.8 mEq/L (ref 3.5–5.3)
Sodium: 141 mEq/L (ref 135–145)

## 2014-03-17 LAB — LIPID PANEL
CHOL/HDL RATIO: 3.3 ratio
CHOLESTEROL: 179 mg/dL (ref 0–200)
HDL: 55 mg/dL (ref >39)
LDL CALC: 94 mg/dL (ref 0–99)
TRIGLYCERIDES: 149 mg/dL (ref <150)
VLDL: 30 mg/dL (ref 0–40)

## 2014-03-17 LAB — HEMOGLOBIN A1C
HEMOGLOBIN A1C: 5.4 % (ref <5.7)
Mean Plasma Glucose: 108 mg/dL (ref <117)

## 2014-03-17 LAB — TSH: TSH: 2.14 u[IU]/mL (ref 0.350–4.500)

## 2014-03-20 ENCOUNTER — Other Ambulatory Visit: Payer: BC Managed Care – PPO

## 2014-03-26 ENCOUNTER — Other Ambulatory Visit: Payer: BC Managed Care – PPO

## 2014-03-28 ENCOUNTER — Ambulatory Visit
Admission: RE | Admit: 2014-03-28 | Discharge: 2014-03-28 | Disposition: A | Discharge status: Home / Self Care | Payer: BC Managed Care – PPO | Source: Ambulatory Visit | Attending: Emergency Medicine | Admitting: Emergency Medicine

## 2014-03-28 DIAGNOSIS — R5381 Other malaise: Secondary | ICD-10-CM

## 2014-03-28 DIAGNOSIS — R5383 Other fatigue: Secondary | ICD-10-CM

## 2014-03-28 DIAGNOSIS — R413 Other amnesia: Secondary | ICD-10-CM

## 2014-03-28 MED ORDER — GADOBENATE DIMEGLUMINE 529 MG/ML IV SOLN
10.0000 mL | Freq: Once | INTRAVENOUS | Status: AC | PRN
  Administered 2014-03-28: 10 mL via INTRAVENOUS

## 2014-06-14 ENCOUNTER — Encounter: Payer: Self-pay | Admitting: Internal Medicine

## 2014-06-29 ENCOUNTER — Other Ambulatory Visit: Payer: Self-pay | Admitting: Physician Assistant

## 2014-06-29 MED ORDER — ALPRAZOLAM 1 MG PO TABS: 1.0000 mg | ORAL_TABLET | Freq: Two times a day (BID) | ORAL | Status: DC | PRN

## 2014-07-30 ENCOUNTER — Telehealth: Payer: Self-pay

## 2014-07-30 NOTE — Telephone Encounter (Signed)
Patient called requesting a referral sent to [facility] to see Dr. Amedeo Plenty for trigger finger. I am unable to submit referral to [facility] because Dr.McKeown is not listed as primary care physician. Patient is advised to call [employer] to have Dr.McKeown as PCP. After she calls [facility], patient is advised to call me back so I can submit referral.

## 2014-07-30 NOTE — Telephone Encounter (Signed)
Patient called insurance and had Dr.McKeown listed as PCP. Requesting referral to see Dr.Gramig for trigger finger of both hands. Submitted referral to [facility] online. Authorization # [id number]. Valid from 07-30-14 to 01-28-15. 6 visits approved. Patient aware and will call Dr.Gramig to schedule appointment,

## 2014-10-28 ENCOUNTER — Encounter: Payer: Self-pay | Admitting: Internal Medicine

## 2014-11-03 ENCOUNTER — Encounter: Payer: Self-pay | Admitting: Internal Medicine

## 2014-11-03 ENCOUNTER — Other Ambulatory Visit: Payer: Self-pay | Admitting: Internal Medicine

## 2014-11-03 DIAGNOSIS — F411 Generalized anxiety disorder: Secondary | ICD-10-CM

## 2014-11-03 DIAGNOSIS — G4700 Insomnia, unspecified: Secondary | ICD-10-CM

## 2014-11-03 MED ORDER — ALPRAZOLAM 1 MG PO TABS: ORAL_TABLET | ORAL | Status: AC

## 2014-11-10 ENCOUNTER — Encounter: Payer: Self-pay | Admitting: Internal Medicine

## 2014-11-10 ENCOUNTER — Ambulatory Visit (INDEPENDENT_AMBULATORY_CARE_PROVIDER_SITE_OTHER): Payer: 59 | Admitting: Internal Medicine

## 2014-11-10 VITALS — BP 102/60 | HR 68 | Temp 98.0°F | Resp 16 | Ht 62.0 in | Wt 116.0 lb

## 2014-11-10 DIAGNOSIS — R1011 Right upper quadrant pain: Secondary | ICD-10-CM

## 2014-11-10 DIAGNOSIS — I10 Essential (primary) hypertension: Secondary | ICD-10-CM

## 2014-11-10 DIAGNOSIS — Z79899 Other long term (current) drug therapy: Secondary | ICD-10-CM

## 2014-11-10 DIAGNOSIS — F419 Anxiety disorder, unspecified: Secondary | ICD-10-CM

## 2014-11-10 DIAGNOSIS — E559 Vitamin D deficiency, unspecified: Secondary | ICD-10-CM

## 2014-11-10 DIAGNOSIS — Z1329 Encounter for screening for other suspected endocrine disorder: Secondary | ICD-10-CM

## 2014-11-10 DIAGNOSIS — E785 Hyperlipidemia, unspecified: Secondary | ICD-10-CM

## 2014-11-10 DIAGNOSIS — Z130 Encounter for screening for diseases of the blood and blood-forming organs and certain disorders involving the immune mechanism: Secondary | ICD-10-CM

## 2014-11-10 DIAGNOSIS — Z131 Encounter for screening for diabetes mellitus: Secondary | ICD-10-CM

## 2014-11-10 DIAGNOSIS — Z1389 Encounter for screening for other disorder: Secondary | ICD-10-CM

## 2014-11-10 DIAGNOSIS — Z1212 Encounter for screening for malignant neoplasm of rectum: Secondary | ICD-10-CM

## 2014-11-10 LAB — MAGNESIUM: MAGNESIUM: 2.3 mg/dL (ref 1.5–2.5)

## 2014-11-10 LAB — CBC WITH DIFFERENTIAL/PLATELET
BASOS PCT: 0 % (ref 0–1)
Basophils Absolute: 0 10*3/uL (ref 0.0–0.1)
Eosinophils Absolute: 0.1 10*3/uL (ref 0.0–0.7)
Eosinophils Relative: 2 % (ref 0–5)
HEMATOCRIT: 43 % (ref 36.0–46.0)
Hemoglobin: 14.2 g/dL (ref 12.0–15.0)
Lymphocytes Relative: 38 % (ref 12–46)
Lymphs Abs: 1.9 10*3/uL (ref 0.7–4.0)
MCH: 30.1 pg (ref 26.0–34.0)
MCHC: 33 g/dL (ref 30.0–36.0)
MCV: 91.3 fL (ref 78.0–100.0)
MONO ABS: 0.5 10*3/uL (ref 0.1–1.0)
MPV: 9.7 fL (ref 8.6–12.4)
Monocytes Relative: 9 % (ref 3–12)
Neutro Abs: 2.6 10*3/uL (ref 1.7–7.7)
Neutrophils Relative %: 51 % (ref 43–77)
Platelets: 218 10*3/uL (ref 150–400)
RBC: 4.71 MIL/uL (ref 3.87–5.11)
RDW: 13.9 % (ref 11.5–15.5)
WBC: 5 10*3/uL (ref 4.0–10.5)

## 2014-11-10 LAB — HEPATIC FUNCTION PANEL
ALT: 10 U/L (ref 0–35)
AST: 20 U/L (ref 0–37)
Albumin: 4.4 g/dL (ref 3.5–5.2)
Alkaline Phosphatase: 116 U/L (ref 39–117)
BILIRUBIN DIRECT: 0.1 mg/dL (ref 0.0–0.3)
BILIRUBIN INDIRECT: 0.6 mg/dL (ref 0.2–1.2)
TOTAL PROTEIN: 7.2 g/dL (ref 6.0–8.3)
Total Bilirubin: 0.7 mg/dL (ref 0.2–1.2)

## 2014-11-10 LAB — BASIC METABOLIC PANEL WITH GFR
BUN: 9 mg/dL (ref 6–23)
CO2: 28 meq/L (ref 19–32)
CREATININE: 0.58 mg/dL (ref 0.50–1.10)
Calcium: 9.8 mg/dL (ref 8.4–10.5)
Chloride: 106 mEq/L (ref 96–112)
GFR, Est African American: >89 mL/min
GFR, Est Non African American: >89 mL/min
Glucose, Bld: 84 mg/dL (ref 70–99)
POTASSIUM: 4.6 meq/L (ref 3.5–5.3)
Sodium: 141 mEq/L (ref 135–145)

## 2014-11-10 LAB — TSH: TSH: 1.375 u[IU]/mL (ref 0.350–4.500)

## 2014-11-10 LAB — IRON AND TIBC
%SAT: 45 % (ref 20–55)
Iron: 147 ug/dL — ABNORMAL HIGH (ref 42–145)
TIBC: 328 ug/dL (ref 250–470)
UIBC: 181 ug/dL (ref 125–400)

## 2014-11-10 LAB — LIPID PANEL
Cholesterol: 206 mg/dL — ABNORMAL HIGH (ref 0–200)
HDL: 56 mg/dL (ref >=46)
LDL Cholesterol: 129 mg/dL — ABNORMAL HIGH (ref 0–99)
Total CHOL/HDL Ratio: 3.7 Ratio
Triglycerides: 103 mg/dL (ref <150)
VLDL: 21 mg/dL (ref 0–40)

## 2014-11-10 LAB — HEMOGLOBIN A1C
HEMOGLOBIN A1C: 5.5 % (ref <5.7)
Mean Plasma Glucose: 111 mg/dL (ref <117)

## 2014-11-10 LAB — VITAMIN B12: VITAMIN B 12: 353 pg/mL (ref 211–911)

## 2014-11-10 MED ORDER — SERTRALINE HCL 25 MG PO TABS: ORAL_TABLET | ORAL | Status: DC

## 2014-11-10 NOTE — Progress Notes (Signed)
Patient ID: Kayla Ware, female   DOB: Aug 13, 1960, 54 y.o.   MRN: [id number]  Complete Physical  Assessment and Plan:   1. Anxiety  - sertraline (ZOLOFT) 25 MG tablet; Take 12.5 mg daily x 2 weeks.  If no side effects increase to 25 mg daily.  Dispense: 30 tablet; Refill: 2  2. Hyperlipidemia  - Lipid panel - EKG 12-Lead - [place], RETROPERITNL ABD,  LTD  3. Vitamin D deficiency -cont supplement - Vit D  25 hydroxy (rtn osteoporosis monitoring)  4. Screening for diabetes mellitus  - Hemoglobin A1c - Insulin, random  5. Screening for hypothyroidism  - TSH  6. Screening for deficiency anemia  - Iron and TIBC - Vitamin B12  7. Screening for rectal cancer  - POC Hemoccult Bld/Stl (3-Cd Home Screen); Future  8. Screening for hematuria or proteinuria  - Urinalysis, Routine w reflex microscopic - Microalbumin / creatinine urine ratio  9. Medication management  - CBC with Differential/Platelet - BASIC METABOLIC PANEL WITH GFR - Hepatic function panel - Magnesium  10. RUQ pain -hx of dysfunction - US Abdomen Limited RUQ; Future   Discussed med's effects and SE's. Screening labs and tests as requested with regular follow-up as recommended.  HPI  54 y.o. female  presents for a complete physical.  Her blood pressure has been controlled at home, today their BP is BP: 102/60 mmHg.  She does not workout.  She is always constantly busy and does a lot of gardening.   She denies chest pain, shortness of breath, dizziness.   She is not on cholesterol medication and denies myalgias. Her cholesterol is at goal. The cholesterol last visit was:  Lab Results  Component Value Date   CHOL 179 03/16/2014   HDL 55 03/16/2014   LDLCALC 94 03/16/2014   TRIG 149 03/16/2014   CHOLHDL 3.3 03/16/2014    Patient is on Vitamin D supplement.   Lab Results  Component Value Date   VD25OH 35 10/27/2013     She reports that she has issues hot flashes.  She has them multiple times  a day and has several per hour.  She reports that it is disruptive to her day.  She reports that she has tried several medications orally and several compounded creams and cannot tolerate.  She reports that she does have a lot of acid reflux and she take prilosec which does help.    She is also concerned about her gallbladder. It has been bothering her more frequently and are usually multiple times per week.  She states that she has a dull ache.  She reports that sometimes it is after eating and sometimes it is not related to food.    She does reports some anxiety.  She feels that this comes on before she has a hot flash and it is similar to a panic attack.    Current Medications:  Current Outpatient Prescriptions on File Prior to Visit  Medication Sig Dispense Refill  . ALPRAZolam (XANAX) 1 MG tablet Take 1/2 to 1 tablet 1 or 2 x daily if needed for anxiety or sleep 60 tablet 0  . aspirin 81 MG tablet Take 81 mg by mouth daily.    . B Complex Vitamins (VITAMIN-B COMPLEX PO) Take by mouth daily.    . Flaxseed, Linseed, (FLAXSEED OIL PO) Take by mouth daily.    . Red Yeast Rice 600 MG CAPS Take 1,200 mg by mouth.     No current facility-administered medications on  file prior to visit.    Health Maintenance:   Immunization History  Administered Date(s) Administered  . Tdap 10/28/2012   Patient Care Team: Unk Pinto, MD as PCP - General (Internal Medicine) Jerrell Belfast, MD as Consulting Physician (Otolaryngology) Dian Queen, MD as Consulting Physician (Obstetrics and Gynecology)  Allergies:  Allergies  Allergen Reactions  . Citalopram Other (See Comments)    FATIGUE   . Erythromycin Nausea And Vomiting  . Meloxicam Other (See Comments)    GI UPSET  . Propofol Other (See Comments)    TINNITUS    Medical History:  Past Medical History  Diagnosis Date  . Anxiety   . Heart murmur   . Hyperlipidemia   . Depression   . Gall stones   . Unspecified vitamin D  deficiency     Surgical History:  Past Surgical History  Procedure Laterality Date  . None      Family History:  Family History  Problem Relation Age of Onset  . Colon polyps Father   . Hyperlipidemia Father   . Depression Father   . Colon cancer Paternal Aunt 63  . Arthritis Brother     RHEUMATOID  . Cancer Maternal Aunt     breast, cervical  . Cancer Maternal Grandmother   . Birth defects Maternal Grandmother     breast  . Diabetes Paternal Grandmother     Social History:  History  Substance Use Topics  . Smoking status: Never Smoker   . Smokeless tobacco: Never Used  . Alcohol Use: 3.6 oz/week    6 Cans of beer per week    Review of Systems: Review of Systems  Constitutional: Positive for diaphoresis. Negative for fever, chills, weight loss and malaise/fatigue.  HENT: Negative for congestion, ear pain, sore throat and tinnitus.   Eyes: Negative.   Respiratory: Negative for cough, shortness of breath and wheezing.   Cardiovascular: Negative for chest pain, palpitations and leg swelling.  Gastrointestinal: Positive for heartburn and constipation. Negative for nausea, vomiting, diarrhea, blood in stool and melena.  Genitourinary: Negative for dysuria, urgency, frequency and hematuria.  Musculoskeletal: Negative.   Skin: Negative.   Neurological: Negative for dizziness, tingling, tremors, loss of consciousness and headaches.  Psychiatric/Behavioral: Negative for depression. The patient is nervous/anxious and has insomnia.     Physical Exam: Estimated body mass index is 21.21 kg/(m^2) as calculated from the following:   Height as of this encounter: 5\' 2"  (1.575 m).   Weight as of this encounter: 116 lb (52.617 kg). BP 102/60 mmHg  Pulse 68  Temp(Src) 98 F (36.7 C) (Temporal)  Resp 16  Ht 5\' 2"  (1.575 m)  Wt 116 lb (52.617 kg)  BMI 21.21 kg/m2  General Appearance: Well nourished well developed, in no apparent distress.  Eyes: PERRLA, EOMs, conjunctiva no  swelling or erythema ENT/Mouth: Ear canals normal without obstruction, swelling, erythema, or discharge.  TMs normal bilaterally with no erythema, bulging, retraction, or loss of landmark.  Oropharynx moist and clear with no exudate, erythema, or swelling.   Neck: Supple, thyroid normal. No bruits.  No cervical adenopathy Respiratory: Respiratory effort normal, Breath sounds clear A&P without wheeze, rhonchi, rales.   Cardio: RRR without murmurs, rubs or gallops. Brisk peripheral pulses without edema.  Chest: symmetric, with normal excursions Breasts: Symmetric, without lumps, nipple discharge, retractions.  Abdomen: Soft, nontender, no guarding, rebound, hernias, masses, or organomegaly.  Lymphatics: Non tender without lymphadenopathy.  Musculoskeletal: Full ROM all peripheral extremities,5/5 strength, and normal gait.  Skin:  Warm, dry without rashes, lesions, ecchymosis. Neuro: Awake and oriented X 3, Cranial nerves intact, reflexes equal bilaterally. Normal muscle tone, no cerebellar symptoms. Sensation intact.  Psych:  Depressed affect, Insight and Judgment appropriate.   EKG: WNL no changes.  AORTA SCAN: WNL   Over 40 minutes of exam, counseling, chart review and critical decision making was performed  Ware, Kayla Cress 10:34 AM [facility]

## 2014-11-10 NOTE — Patient Instructions (Signed)
Sertraline tablets What is this medicine? SERTRALINE (SER tra leen) is used to treat depression. It may also be used to treat obsessive compulsive disorder, panic disorder, post-trauma stress, premenstrual dysphoric disorder (PMDD) or social anxiety. This medicine may be used for other purposes; ask your health care provider or pharmacist if you have questions. COMMON BRAND NAME(S): Zoloft What should I tell my health care provider before I take this medicine? They need to know if you have any of these conditions: -bipolar disorder or a family history of bipolar disorder -diabetes -glaucoma -heart disease -high blood pressure -history of irregular heartbeat -history of low levels of calcium, magnesium, or potassium in the blood -if you often drink alcohol -liver disease -receiving electroconvulsive therapy -seizures -suicidal thoughts, plans, or attempt; a previous suicide attempt by you or a family member -thyroid disease -an unusual or allergic reaction to sertraline, other medicines, foods, dyes, or preservatives -pregnant or trying to get pregnant -breast-feeding How should I use this medicine? Take this medicine by mouth with a glass of water. Follow the directions on the prescription label. You can take it with or without food. Take your medicine at regular intervals. Do not take your medicine more often than directed. Do not stop taking this medicine suddenly except upon the advice of your doctor. Stopping this medicine too quickly may cause serious side effects or your condition may worsen. A special MedGuide will be given to you by the pharmacist with each prescription and refill. Be sure to read this information carefully each time. Talk to your pediatrician regarding the use of this medicine in children. While this drug may be prescribed for children as young as 7 years for selected conditions, precautions do apply. Overdosage: If you think you have taken too much of this  medicine contact a poison control center or emergency room at once. NOTE: This medicine is only for you. Do not share this medicine with others. What if I miss a dose? If you miss a dose, take it as soon as you can. If it is almost time for your next dose, take only that dose. Do not take double or extra doses. What may interact with this medicine? Do not take this medicine with any of the following medications: -certain medicines for fungal infections like fluconazole, itraconazole, ketoconazole, posaconazole, voriconazole -cisapride -disulfiram -dofetilide -linezolid -MAOIs like Carbex, Eldepryl, Marplan, Nardil, and Parnate -metronidazole -methylene blue (injected into a vein) -pimozide -thioridazine -ziprasidone This medicine may also interact with the following medications: -alcohol -aspirin and aspirin-like medicines -certain medicines for depression, anxiety, or psychotic disturbances -certain medicines for irregular heart beat like flecainide, propafenone -certain medicines for migraine headaches like almotriptan, eletriptan, frovatriptan, naratriptan, rizatriptan, sumatriptan, zolmitriptan -certain medicines for sleep -certain medicines for seizures like carbamazepine, valproic acid, phenytoin -certain medicines that treat or prevent blood clots like warfarin, enoxaparin, dalteparin -cimetidine -digoxin -diuretics -fentanyl -furazolidone -isoniazid -lithium -NSAIDs, medicines for pain and inflammation, like ibuprofen or naproxen -other medicines that prolong the QT interval (cause an abnormal heart rhythm) -procarbazine -rasagiline -supplements like St. John's wort, kava kava, valerian -tolbutamide -tramadol -tryptophan This list may not describe all possible interactions. Give your health care provider a list of all the medicines, herbs, non-prescription drugs, or dietary supplements you use. Also tell them if you smoke, drink alcohol, or use illegal drugs. Some  items may interact with your medicine. What should I watch for while using this medicine? Tell your doctor if your symptoms do not get better or if they get  worse. Visit your doctor or health care professional for regular checks on your progress. Because it may take several weeks to see the full effects of this medicine, it is important to continue your treatment as prescribed by your doctor. Patients and their families should watch out for new or worsening thoughts of suicide or depression. Also watch out for sudden changes in feelings such as feeling anxious, agitated, panicky, irritable, hostile, aggressive, impulsive, severely restless, overly excited and hyperactive, or not being able to sleep. If this happens, especially at the beginning of treatment or after a change in dose, call your health care professional. You may get drowsy or dizzy. Do not drive, use machinery, or do anything that needs mental alertness until you know how this medicine affects you. Do not stand or sit up quickly, especially if you are an older patient. This reduces the risk of dizzy or fainting spells. Alcohol may interfere with the effect of this medicine. Avoid alcoholic drinks. Your mouth may get dry. Chewing sugarless gum or sucking hard candy, and drinking plenty of water may help. Contact your doctor if the problem does not go away or is severe. What side effects may I notice from receiving this medicine? Side effects that you should report to your doctor or health care professional as soon as possible: -allergic reactions like skin rash, itching or hives, swelling of the face, lips, or tongue -black or bloody stools, blood in the urine or vomit -fast, irregular heartbeat -feeling faint or lightheaded, falls -hallucination, loss of contact with reality -seizures -suicidal thoughts or other mood changes -unusual bleeding or bruising -unusually weak or tired -vomiting Side effects that usually do not require  medical attention (report to your doctor or health care professional if they continue or are bothersome): -change in appetite -change in sex drive or performance -diarrhea -increased sweating -indigestion, nausea -tremors This list may not describe all possible side effects. Call your doctor for medical advice about side effects. You may report side effects to FDA at 1-800-FDA-1088. Where should I keep my medicine? Keep out of the reach of children. Store at room temperature between 15 and 30 degrees C (59 and 86 degrees F). Throw away any unused medicine after the expiration date. NOTE: This sheet is a summary. It may not cover all possible information. If you have questions about this medicine, talk to your doctor, pharmacist, or health care provider.  2015, [employer]. (2013-01-28 12:57:35)   Preventive Care for Adults  A healthy lifestyle and preventive care can promote health and wellness. Preventive health guidelines for women include the following key practices.  A routine yearly physical is a good way to check with your health care provider about your health and preventive screening. It is a chance to share any concerns and updates on your health and to receive a thorough exam.  Visit your dentist for a routine exam and preventive care every 6 months. Brush your teeth twice a day and floss once a day. Good oral hygiene prevents tooth decay and gum disease.  The frequency of eye exams is based on your age, health, family medical history, use of contact lenses, and other factors. Follow your health care provider's recommendations for frequency of eye exams.  Eat a healthy diet. Foods like vegetables, fruits, whole grains, low-fat dairy products, and lean protein foods contain the nutrients you need without too many calories. Decrease your intake of foods high in solid fats, added sugars, and salt. Eat the right amount of   calories for you.Get information about a proper  diet from your health care provider, if necessary.  Regular physical exercise is one of the most important things you can do for your health. Most adults should get at least 150 minutes of moderate-intensity exercise (any activity that increases your heart rate and causes you to sweat) each week. In addition, most adults need muscle-strengthening exercises on 2 or more days a week.  Maintain a healthy weight. The body mass index (BMI) is a screening tool to identify possible weight problems. It provides an estimate of body fat based on height and weight. Your health care provider can find your BMI and can help you achieve or maintain a healthy weight.For adults 20 years and older:  A BMI below 18.5 is considered underweight.  A BMI of 18.5 to 24.9 is normal.  A BMI of 25 to 29.9 is considered overweight.  A BMI of 30 and above is considered obese.  Maintain normal blood lipids and cholesterol levels by exercising and minimizing your intake of saturated fat. Eat a balanced diet with plenty of fruit and vegetables. Blood tests for lipids and cholesterol should begin at age 20 and be repeated every 5 years. If your lipid or cholesterol levels are high, you are over 50, or you are at high risk for heart disease, you may need your cholesterol levels checked more frequently.Ongoing high lipid and cholesterol levels should be treated with medicines if diet and exercise are not working.  If you smoke, find out from your health care provider how to quit. If you do not use tobacco, do not start.  Lung cancer screening is recommended for adults aged 55-80 years who are at high risk for developing lung cancer because of a history of smoking. A yearly low-dose CT scan of the lungs is recommended for people who have at least a 30-pack-year history of smoking and are a current smoker or have quit within the past 15 years. A pack year of smoking is smoking an average of 1 pack of cigarettes a day for 1 year (for  example: 1 pack a day for 30 years or 2 packs a day for 15 years). Yearly screening should continue until the smoker has stopped smoking for at least 15 years. Yearly screening should be stopped for people who develop a health problem that would prevent them from having lung cancer treatment.  High blood pressure causes heart disease and increases the risk of stroke. Your blood pressure should be checked at least every 1 to 2 years. Ongoing high blood pressure should be treated with medicines if weight loss and exercise do not work.  If you are 55-79 years old, ask your health care provider if you should take aspirin to prevent strokes.  Diabetes screening involves taking a blood sample to check your fasting blood sugar level. This should be done once every 3 years, after age 45, if you are within normal weight and without risk factors for diabetes. Testing should be considered at a younger age or be carried out more frequently if you are overweight and have at least 1 risk factor for diabetes.  Breast cancer screening is essential preventive care for women. You should practice "breast self-awareness." This means understanding the normal appearance and feel of your breasts and may include breast self-examination. Any changes detected, no matter how small, should be reported to a health care provider. Women in their 20s and 30s should have a clinical breast exam (CBE) by a health   care provider as part of a regular health exam every 1 to 3 years. After age 40, women should have a CBE every year. Starting at age 40, women should consider having a mammogram (breast X-ray test) every year. Women who have a family history of breast cancer should talk to their health care provider about genetic screening. Women at a high risk of breast cancer should talk to their health care providers about having an MRI and a mammogram every year.  Breast cancer gene (BRCA)-related cancer risk assessment is recommended for women  who have family members with BRCA-related cancers. BRCA-related cancers include breast, ovarian, tubal, and peritoneal cancers. Having family members with these cancers may be associated with an increased risk for harmful changes (mutations) in the breast cancer genes BRCA1 and BRCA2. Results of the assessment will determine the need for genetic counseling and BRCA1 and BRCA2 testing.  Routine pelvic exams to screen for cancer are no longer recommended for nonpregnant women who are considered low risk for cancer of the pelvic organs (ovaries, uterus, and vagina) and who do not have symptoms. Ask your health care provider if a screening pelvic exam is right for you.  If you have had past treatment for cervical cancer or a condition that could lead to cancer, you need Pap tests and screening for cancer for at least 20 years after your treatment. If Pap tests have been discontinued, your risk factors (such as having a new sexual partner) need to be reassessed to determine if screening should be resumed. Some women have medical problems that increase the chance of getting cervical cancer. In these cases, your health care provider may recommend more frequent screening and Pap tests.  Colorectal cancer can be detected and often prevented. Most routine colorectal cancer screening begins at the age of 50 years and continues through age 75 years. However, your health care provider may recommend screening at an earlier age if you have risk factors for colon cancer. On a yearly basis, your health care provider may provide home test kits to check for hidden blood in the stool. Use of a small camera at the end of a tube, to directly examine the colon (sigmoidoscopy or colonoscopy), can detect the earliest forms of colorectal cancer. Talk to your health care provider about this at age 50, when routine screening begins. Direct exam of the colon should be repeated every 5-10 years through age 75 years, unless early forms of  pre-cancerous polyps or small growths are found.  Hepatitis C blood testing is recommended for all people born from 1945 through 1965 and any individual with known risks for hepatitis C.  Pra  Osteoporosis is a disease in which the bones lose minerals and strength with aging. This can result in serious bone fractures or breaks. The risk of osteoporosis can be identified using a bone density scan. Women ages 65 years and over and women at risk for fractures or osteoporosis should discuss screening with their health care providers. Ask your health care provider whether you should take a calcium supplement or vitamin D to reduce the rate of osteoporosis.  Menopause can be associated with physical symptoms and risks. Hormone replacement therapy is available to decrease symptoms and risks. You should talk to your health care provider about whether hormone replacement therapy is right for you.  Use sunscreen. Apply sunscreen liberally and repeatedly throughout the day. You should seek shade when your shadow is shorter than you. Protect yourself by wearing long sleeves, pants, a   wide-brimmed hat, and sunglasses year round, whenever you are outdoors.  Once a month, do a whole body skin exam, using a mirror to look at the skin on your back. Tell your health care provider of new moles, moles that have irregular borders, moles that are larger than a pencil eraser, or moles that have changed in shape or color.  Stay current with required vaccines (immunizations).  Influenza vaccine. All adults should be immunized every year.  Tetanus, diphtheria, and acellular pertussis (Td, Tdap) vaccine. Pregnant women should receive 1 dose of Tdap vaccine during each pregnancy. The dose should be obtained regardless of the length of time since the last dose. Immunization is preferred during the 27th-36th week of gestation. An adult who has not previously received Tdap or who does not know her vaccine status should receive 1  dose of Tdap. This initial dose should be followed by tetanus and diphtheria toxoids (Td) booster doses every 10 years. Adults with an unknown or incomplete history of completing a 3-dose immunization series with Td-containing vaccines should begin or complete a primary immunization series including a Tdap dose. Adults should receive a Td booster every 10 years.  Varicella vaccine. An adult without evidence of immunity to varicella should receive 2 doses or a second dose if she has previously received 1 dose. Pregnant females who do not have evidence of immunity should receive the first dose after pregnancy. This first dose should be obtained before leaving the health care facility. The second dose should be obtained 4-8 weeks after the first dose.  Human papillomavirus (HPV) vaccine. Females aged 13-26 years who have not received the vaccine previously should obtain the 3-dose series. The vaccine is not recommended for use in pregnant females. However, pregnancy testing is not needed before receiving a dose. If a female is found to be pregnant after receiving a dose, no treatment is needed. In that case, the remaining doses should be delayed until after the pregnancy. Immunization is recommended for any person with an immunocompromised condition through the age of 26 years if she did not get any or all doses earlier. During the 3-dose series, the second dose should be obtained 4-8 weeks after the first dose. The third dose should be obtained 24 weeks after the first dose and 16 weeks after the second dose.  Zoster vaccine. One dose is recommended for adults aged 60 years or older unless certain conditions are present.  Measles, mumps, and rubella (MMR) vaccine. Adults born before 1957 generally are considered immune to measles and mumps. Adults born in 1957 or later should have 1 or more doses of MMR vaccine unless there is a contraindication to the vaccine or there is laboratory evidence of immunity to  each of the three diseases. A routine second dose of MMR vaccine should be obtained at least 28 days after the first dose for students attending postsecondary schools, health care workers, or international travelers. People who received inactivated measles vaccine or an unknown type of measles vaccine during 1963-1967 should receive 2 doses of MMR vaccine. People who received inactivated mumps vaccine or an unknown type of mumps vaccine before 1979 and are at high risk for mumps infection should consider immunization with 2 doses of MMR vaccine. For females of childbearing age, rubella immunity should be determined. If there is no evidence of immunity, females who are not pregnant should be vaccinated. If there is no evidence of immunity, females who are pregnant should delay immunization until after pregnancy. Unvaccinated health care   workers born before 1957 who lack laboratory evidence of measles, mumps, or rubella immunity or laboratory confirmation of disease should consider measles and mumps immunization with 2 doses of MMR vaccine or rubella immunization with 1 dose of MMR vaccine.  Pneumococcal 13-valent conjugate (PCV13) vaccine. When indicated, a person who is uncertain of her immunization history and has no record of immunization should receive the PCV13 vaccine. An adult aged 19 years or older who has certain medical conditions and has not been previously immunized should receive 1 dose of PCV13 vaccine. This PCV13 should be followed with a dose of pneumococcal polysaccharide (PPSV23) vaccine. The PPSV23 vaccine dose should be obtained at least 8 weeks after the dose of PCV13 vaccine. An adult aged 19 years or older who has certain medical conditions and previously received 1 or more doses of PPSV23 vaccine should receive 1 dose of PCV13. The PCV13 vaccine dose should be obtained 1 or more years after the last PPSV23 vaccine dose.    Pneumococcal polysaccharide (PPSV23) vaccine. When PCV13 is also  indicated, PCV13 should be obtained first. All adults aged 65 years and older should be immunized. An adult younger than age 65 years who has certain medical conditions should be immunized. Any person who resides in a nursing home or long-term care facility should be immunized. An adult smoker should be immunized. People with an immunocompromised condition and certain other conditions should receive both PCV13 and PPSV23 vaccines. People with human immunodeficiency virus (HIV) infection should be immunized as soon as possible after diagnosis. Immunization during chemotherapy or radiation therapy should be avoided. Routine use of PPSV23 vaccine is not recommended for American Indians, [place] Natives, or people younger than 65 years unless there are medical conditions that require PPSV23 vaccine. When indicated, people who have unknown immunization and have no record of immunization should receive PPSV23 vaccine. One-time revaccination 5 years after the first dose of PPSV23 is recommended for people aged 19-64 years who have chronic kidney failure, nephrotic syndrome, asplenia, or immunocompromised conditions. People who received 1-2 doses of PPSV23 before age 65 years should receive another dose of PPSV23 vaccine at age 65 years or later if at least 5 years have passed since the previous dose. Doses of PPSV23 are not needed for people immunized with PPSV23 at or after age 65 years.  Preventive Services / Frequency   Ages 40 to 64 years  Blood pressure check.  Lipid and cholesterol check.  Lung cancer screening. / Every year if you are aged 55-80 years and have a 30-pack-year history of smoking and currently smoke or have quit within the past 15 years. Yearly screening is stopped once you have quit smoking for at least 15 years or develop a health problem that would prevent you from having lung cancer treatment.  Clinical breast exam.** / Every year after age 40 years.  BRCA-related cancer risk  assessment.** / For women who have family members with a BRCA-related cancer (breast, ovarian, tubal, or peritoneal cancers).  Mammogram.** / Every year beginning at age 40 years and continuing for as long as you are in good health. Consult with your health care provider.  Pap test.** / Every 3 years starting at age 30 years through age 65 or 70 years with a history of 3 consecutive normal Pap tests.  HPV screening.** / Every 3 years from ages 30 years through ages 65 to 70 years with a history of 3 consecutive normal Pap tests.  Fecal occult blood test (FOBT) of stool. /   Every year beginning at age 50 years and continuing until age 75 years. You may not need to do this test if you get a colonoscopy every 10 years.  Flexible sigmoidoscopy or colonoscopy.** / Every 5 years for a flexible sigmoidoscopy or every 10 years for a colonoscopy beginning at age 50 years and continuing until age 75 years.  Hepatitis C blood test.** / For all people born from 1945 through 1965 and any individual with known risks for hepatitis C.  Skin self-exam. / Monthly.  Influenza vaccine. / Every year.  Tetanus, diphtheria, and acellular pertussis (Tdap/Td) vaccine.** / Consult your health care provider. Pregnant women should receive 1 dose of Tdap vaccine during each pregnancy. 1 dose of Td every 10 years.  Varicella vaccine.** / Consult your health care provider. Pregnant females who do not have evidence of immunity should receive the first dose after pregnancy.  Zoster vaccine.** / 1 dose for adults aged 60 years or older.  Pneumococcal 13-valent conjugate (PCV13) vaccine.** / Consult your health care provider.  Pneumococcal polysaccharide (PPSV23) vaccine.** / 1 to 2 doses if you smoke cigarettes or if you have certain conditions.  Meningococcal vaccine.** / Consult your health care provider.  Hepatitis A vaccine.** / Consult your health care provider.  Hepatitis B vaccine.** / Consult your health care  provider. Screening for abdominal aortic aneurysm (AAA)  by ultrasound is recommended for people over 50 who have history of high blood pressure or who are current or former smokers. 

## 2014-11-11 LAB — URINALYSIS, ROUTINE W REFLEX MICROSCOPIC
Bilirubin Urine: NEGATIVE
Glucose, UA: NEGATIVE mg/dL
Hgb urine dipstick: NEGATIVE
KETONES UR: NEGATIVE mg/dL
LEUKOCYTES UA: NEGATIVE
Nitrite: NEGATIVE
Protein, ur: NEGATIVE mg/dL
SPECIFIC GRAVITY, URINE: 1.007 (ref 1.005–1.030)
UROBILINOGEN UA: 0.2 mg/dL (ref 0.0–1.0)
pH: 7.5 (ref 5.0–8.0)

## 2014-11-11 LAB — INSULIN, RANDOM: Insulin: 3.1 u[IU]/mL (ref 2.0–19.6)

## 2014-11-11 LAB — MICROALBUMIN / CREATININE URINE RATIO
Creatinine, Urine: 27.5 mg/dL
Microalb, Ur: <0.2 mg/dL (ref <2.0)

## 2014-11-11 LAB — VITAMIN D 25 HYDROXY (VIT D DEFICIENCY, FRACTURES): Vit D, 25-Hydroxy: 32 ng/mL (ref 30–100)

## 2014-12-21 ENCOUNTER — Encounter: Payer: Self-pay | Admitting: Internal Medicine

## 2014-12-21 ENCOUNTER — Ambulatory Visit (INDEPENDENT_AMBULATORY_CARE_PROVIDER_SITE_OTHER): Payer: 59 | Admitting: Internal Medicine

## 2014-12-21 VITALS — BP 98/62 | HR 68 | Temp 98.2°F | Resp 16 | Ht 62.0 in | Wt 114.0 lb

## 2014-12-21 DIAGNOSIS — F411 Generalized anxiety disorder: Secondary | ICD-10-CM

## 2014-12-21 MED ORDER — ALPRAZOLAM 1 MG PO TABS: 1.0000 mg | ORAL_TABLET | Freq: Two times a day (BID) | ORAL | Status: DC | PRN

## 2014-12-21 MED ORDER — SERTRALINE HCL 50 MG PO TABS: 50.0000 mg | ORAL_TABLET | Freq: Every day | ORAL | Status: DC

## 2014-12-21 NOTE — Progress Notes (Signed)
Patient ID: Kayla Ware, female   DOB: 1961-06-12, 54 y.o.   MRN: [id number]  Assessment and Plan:   1. Generalized anxiety disorder -increase zoloft dose to 50 mg daily -xanax prn -see back in 2 months to reevaluate and adjust medication as needed.     HPI 54 y.o.female presents for 1 month follow up of hotflashes and starting zoloft to help with anxiety and hotflashes.    She was having hotflashes and when she was having them she was having panic attacks.  She feels like the hot flashes are not happening as often and now she reports that she is not having the hot flashes as often and the panic attacks are farther apart.  She reports that the last panic attack was a few days ago.  They have lessened in severity and they are not as long.  Patient reports that they have been doing well.  female is taking their medication.  They are not having difficulty with their medications.  They report some mild stomach ache after the first few days and since that time she has not had any issues or adverse reactions.  NO nausea, vomiting, insomnia, diarrhea, constipation, belly pain.  She reports that she still has some mild anxiety.      Past Medical History  Diagnosis Date  . Anxiety   . Heart murmur   . Hyperlipidemia   . Depression   . Gall stones   . Unspecified vitamin D deficiency      Allergies  Allergen Reactions  . Citalopram Other (See Comments)    FATIGUE   . Erythromycin Nausea And Vomiting  . Meloxicam Other (See Comments)    GI UPSET  . Propofol Other (See Comments)    TINNITUS      Current Outpatient Prescriptions on File Prior to Visit  Medication Sig Dispense Refill  . aspirin 81 MG tablet Take 81 mg by mouth daily.    . B Complex Vitamins (VITAMIN-B COMPLEX PO) Take by mouth daily.    . Flaxseed, Linseed, (FLAXSEED OIL PO) Take by mouth daily.    Marland Kitchen omeprazole (PRILOSEC) 20 MG capsule Take 20 mg by mouth daily as needed.    Marland Kitchen OVER THE COUNTER MEDICATION 2 tablets  daily. Estro Soy Herbal Supplement    . Red Yeast Rice 600 MG CAPS Take 1,200 mg by mouth.    . sertraline (ZOLOFT) 25 MG tablet Take 12.5 mg daily x 2 weeks.  If no side effects increase to 25 mg daily. 30 tablet 2  . Vitamin D, Cholecalciferol, 1000 UNITS TABS Take 5,000 Units by mouth daily.      No current facility-administered medications on file prior to visit.    ROS: all negative except above.   Physical Exam: Filed Weights   12/21/14 0902  Weight: 114 lb (51.71 kg)   BP 98/62 mmHg  Pulse 68  Temp(Src) 98.2 F (36.8 C) (Temporal)  Resp 16  Ht 5\' 2"  (1.575 m)  Wt 114 lb (51.71 kg)  BMI 20.85 kg/m2 General Appearance: Well developed well nourished, non-toxic appearing in no apparent distress. Eyes: PERRLA, EOMs, conjunctiva w/ no swelling or erythema or discharge Sinuses: No Frontal/maxillary tenderness ENT/Mouth: Ear canals clear without swelling or erythema.  TM's normal bilaterally with no retractions, bulging, or loss of landmarks.   Neck: Supple, thyroid normal, no notable JVD  Respiratory: Respiratory effort normal, Clear breath sounds anteriorly and posteriorly bilaterally without rales, rhonchi, wheezing or stridor. No retractions or accessory muscle  usage. Cardio: RRR with no MRGs.   Abdomen: Soft, + BS.  Non tender, no guarding, rebound, hernias, masses.  Musculoskeletal: Full ROM, 5/5 strength, normal gait.  Skin: Warm, dry without rashes  Neuro: Awake and oriented X 3, Cranial nerves intact. Normal muscle tone, no cerebellar symptoms. Sensation intact.  Psych: normal affect, Insight and Judgment appropriate.     FORCUCCI, Livier Hendel, PA-C 9:27 AM [facility]

## 2014-12-21 NOTE — Patient Instructions (Signed)
Generalized Anxiety Disorder Generalized anxiety disorder (GAD) is a mental disorder. It interferes with life functions, including relationships, work, and school. GAD is different from normal anxiety, which everyone experiences at some point in their lives in response to specific life events and activities. Normal anxiety actually helps us prepare for and get through these life events and activities. Normal anxiety goes away after the event or activity is over.  GAD causes anxiety that is not necessarily related to specific events or activities. It also causes excess anxiety in proportion to specific events or activities. The anxiety associated with GAD is also difficult to control. GAD can vary from mild to severe. People with severe GAD can have intense waves of anxiety with physical symptoms (panic attacks).  SYMPTOMS The anxiety and worry associated with GAD are difficult to control. This anxiety and worry are related to many life events and activities and also occur more days than not for 6 months or longer. People with GAD also have three or more of the following symptoms (one or more in children):  Restlessness.   Fatigue.  Difficulty concentrating.   Irritability.  Muscle tension.  Difficulty sleeping or unsatisfying sleep. DIAGNOSIS GAD is diagnosed through an assessment by your health care provider. Your health care provider will ask you questions aboutyour mood,physical symptoms, and events in your life. Your health care provider may ask you about your medical history and use of alcohol or drugs, including prescription medicines. Your health care provider may also do a physical exam and blood tests. Certain medical conditions and the use of certain substances can cause symptoms similar to those associated with GAD. Your health care provider may refer you to a mental health specialist for further evaluation. TREATMENT The following therapies are usually used to treat GAD:    Medication. Antidepressant medication usually is prescribed for long-term daily control. Antianxiety medicines may be added in severe cases, especially when panic attacks occur.   Talk therapy (psychotherapy). Certain types of talk therapy can be helpful in treating GAD by providing support, education, and guidance. A form of talk therapy called cognitive behavioral therapy can teach you healthy ways to think about and react to daily life events and activities.  Stress managementtechniques. These include yoga, meditation, and exercise and can be very helpful when they are practiced regularly. A mental health specialist can help determine which treatment is best for you. Some people see improvement with one therapy. However, other people require a combination of therapies. Document Released: 10/28/2012 Document Revised: 11/17/2013 Document Reviewed: 10/28/2012 Exit[employer] Patient Information 2015 [employer]. This information is not intended to replace advice given to you by your health care provider. Make sure you discuss any questions you have with your health care provider.  

## 2014-12-23 NOTE — Addendum Note (Signed)
Addended by: Starlyn Skeans A on: 12/23/2014 01:20 PM   Modules accepted: Miquel Dunn

## 2015-02-22 ENCOUNTER — Other Ambulatory Visit: Payer: Self-pay | Admitting: Internal Medicine

## 2015-03-01 ENCOUNTER — Encounter: Payer: Self-pay | Admitting: Internal Medicine

## 2015-03-01 ENCOUNTER — Ambulatory Visit (INDEPENDENT_AMBULATORY_CARE_PROVIDER_SITE_OTHER): Payer: 59 | Admitting: Internal Medicine

## 2015-03-01 VITALS — BP 120/78 | HR 74 | Temp 97.8°F | Resp 16 | Ht 62.0 in | Wt 111.0 lb

## 2015-03-01 DIAGNOSIS — E559 Vitamin D deficiency, unspecified: Secondary | ICD-10-CM

## 2015-03-01 DIAGNOSIS — F419 Anxiety disorder, unspecified: Secondary | ICD-10-CM | POA: Diagnosis not present

## 2015-03-01 DIAGNOSIS — R7303 Prediabetes: Secondary | ICD-10-CM | POA: Insufficient documentation

## 2015-03-01 DIAGNOSIS — E785 Hyperlipidemia, unspecified: Secondary | ICD-10-CM

## 2015-03-01 DIAGNOSIS — R7309 Other abnormal glucose: Secondary | ICD-10-CM | POA: Diagnosis not present

## 2015-03-01 DIAGNOSIS — Z780 Asymptomatic menopausal state: Secondary | ICD-10-CM

## 2015-03-01 DIAGNOSIS — Z79899 Other long term (current) drug therapy: Secondary | ICD-10-CM | POA: Insufficient documentation

## 2015-03-01 LAB — HEPATIC FUNCTION PANEL
ALT: 8 U/L (ref 6–29)
AST: 17 U/L (ref 10–35)
Albumin: 4.3 g/dL (ref 3.6–5.1)
Alkaline Phosphatase: 99 U/L (ref 33–130)
BILIRUBIN DIRECT: 0.1 mg/dL (ref <=0.2)
Indirect Bilirubin: 0.6 mg/dL (ref 0.2–1.2)
Total Bilirubin: 0.7 mg/dL (ref 0.2–1.2)
Total Protein: 7.3 g/dL (ref 6.1–8.1)

## 2015-03-01 LAB — CBC WITH DIFFERENTIAL/PLATELET
Basophils Absolute: 0 10*3/uL (ref 0.0–0.1)
Basophils Relative: 0 % (ref 0–1)
Eosinophils Absolute: 0.1 10*3/uL (ref 0.0–0.7)
Eosinophils Relative: 1 % (ref 0–5)
HCT: 41.9 % (ref 36.0–46.0)
HEMOGLOBIN: 14 g/dL (ref 12.0–15.0)
LYMPHS ABS: 2.3 10*3/uL (ref 0.7–4.0)
LYMPHS PCT: 35 % (ref 12–46)
MCH: 30.6 pg (ref 26.0–34.0)
MCHC: 33.4 g/dL (ref 30.0–36.0)
MCV: 91.7 fL (ref 78.0–100.0)
MPV: 9.2 fL (ref 8.6–12.4)
Monocytes Absolute: 0.6 10*3/uL (ref 0.1–1.0)
Monocytes Relative: 9 % (ref 3–12)
Neutro Abs: 3.6 10*3/uL (ref 1.7–7.7)
Neutrophils Relative %: 55 % (ref 43–77)
Platelets: 216 10*3/uL (ref 150–400)
RBC: 4.57 MIL/uL (ref 3.87–5.11)
RDW: 14.3 % (ref 11.5–15.5)
WBC: 6.5 10*3/uL (ref 4.0–10.5)

## 2015-03-01 LAB — BASIC METABOLIC PANEL WITH GFR
BUN: 10 mg/dL (ref 7–25)
CHLORIDE: 104 mmol/L (ref 98–110)
CO2: 28 mmol/L (ref 20–31)
Calcium: 9.6 mg/dL (ref 8.6–10.4)
Creat: 0.73 mg/dL (ref 0.50–1.05)
GFR, Est African American: >89 mL/min (ref >=60)
GFR, Est Non African American: >89 mL/min (ref >=60)
Glucose, Bld: 93 mg/dL (ref 65–99)
Potassium: 4.8 mmol/L (ref 3.5–5.3)
SODIUM: 145 mmol/L (ref 135–146)

## 2015-03-01 LAB — LIPID PANEL
CHOL/HDL RATIO: 3.5 ratio (ref <=5.0)
Cholesterol: 204 mg/dL — ABNORMAL HIGH (ref 125–200)
HDL: 58 mg/dL (ref >=46)
LDL CALC: 121 mg/dL (ref <130)
Triglycerides: 126 mg/dL (ref <150)
VLDL: 25 mg/dL (ref <30)

## 2015-03-01 LAB — MAGNESIUM: Magnesium: 2.3 mg/dL (ref 1.5–2.5)

## 2015-03-01 NOTE — Progress Notes (Signed)
Patient ID: Kayla Ware, female   DOB: [birth year], 54 y.o.   MRN: [id number]  Assessment and Plan:  Anxiety - Cont zoloft at 50 mg can increase as needed -cont xanax -refer to obgyn for possible HRT  Cholesterol: -Continue diet and exercise.  -Check cholesterol.   Pre-diabetes: -Continue diet and exercise.  -Check A1C  Vitamin D Def: -check level -continue medications.   Continue diet and meds as discussed. Further disposition pending results of labs.  HPI 54 y.o. female  presents for 3 month follow up with hypertension, hyperlipidemia, prediabetes and vitamin D.   Her blood pressure has been controlled at home, today their BP is BP: 120/78 mmHg.   She does workout. She denies chest pain, shortness of breath, dizziness.  She reports that she is still working 3 jobs and is gardening.     She is on cholesterol medication and denies myalgias. Her cholesterol is at goal. The cholesterol last visit was:   Lab Results  Component Value Date   CHOL 206* 11/10/2014   HDL 56 11/10/2014   LDLCALC 129* 11/10/2014   TRIG 103 11/10/2014   CHOLHDL 3.7 11/10/2014     She has been working on diet and exercise for prediabetes, and denies foot ulcerations, hyperglycemia, hypoglycemia , increased appetite, nausea, paresthesia of the feet, polydipsia, polyuria, visual disturbances, vomiting and weight loss. Last A1C in the office was:  Lab Results  Component Value Date   HGBA1C 5.5 11/10/2014    Patient is on Vitamin D supplement.  Lab Results  Component Value Date   VD25OH 32 11/10/2014    She is taking 5,000 IU daily.    She reports that she has been doing pretty well with the anxiety over the hot flashes.  She reports that she is down to 1-2 per day.  She reports that the anxiety is not nearly as bad as it was.    She reports that her ears have been bothering her off and on for the past couple weeks.  She reports that she has been having some sore throat and slightly congestion  and runny nose.  She does normally have some trouble with sinus headaches.   Current Medications:  Current Outpatient Prescriptions on File Prior to Visit  Medication Sig Dispense Refill  . ALPRAZolam (XANAX) 1 MG tablet TAKE ONE TABLET BY MOUTH TWICE DAILY AS NEEDED FOR SLEEP 60 tablet 0  . aspirin 81 MG tablet Take 81 mg by mouth daily.    . B Complex Vitamins (VITAMIN-B COMPLEX PO) Take by mouth daily.    . Flaxseed, Linseed, (FLAXSEED OIL PO) Take by mouth daily.    . sertraline (ZOLOFT) 50 MG tablet Take 1 tablet (50 mg total) by mouth daily. 30 tablet 2  . Vitamin D, Cholecalciferol, 1000 UNITS TABS Take 5,000 Units by mouth daily.     Marland Kitchen omeprazole (PRILOSEC) 20 MG capsule Take 20 mg by mouth daily as needed.     No current facility-administered medications on file prior to visit.    Medical History:  Past Medical History  Diagnosis Date  . Anxiety   . Heart murmur   . Hyperlipidemia   . Depression   . Gall stones   . Unspecified vitamin D deficiency     Allergies:  Allergies  Allergen Reactions  . Citalopram Other (See Comments)    FATIGUE   . Erythromycin Nausea And Vomiting  . Meloxicam Other (See Comments)    GI UPSET  . Propofol Other (  See Comments)    TINNITUS     Review of Systems:  Review of Systems  Constitutional: Negative for fever, chills and malaise/fatigue.  HENT: Positive for congestion and ear pain. Negative for sore throat.   Eyes: Negative for blurred vision and double vision.  Respiratory: Negative for cough, shortness of breath and wheezing.   Cardiovascular: Negative for chest pain, palpitations and leg swelling.  Gastrointestinal: Positive for constipation. Negative for heartburn, diarrhea, blood in stool and melena.  Genitourinary: Negative.   Skin: Negative.   Neurological: Positive for headaches. Negative for dizziness and loss of consciousness.  Psychiatric/Behavioral: Negative for depression. The patient is nervous/anxious. The  patient does not have insomnia.     Family history- Review and unchanged  Social history- Review and unchanged  Physical Exam: BP 120/78 mmHg  Pulse 74  Temp(Src) 97.8 F (36.6 C) (Temporal)  Resp 16  Ht 5\' 2"  (1.575 m)  Wt 111 lb (50.349 kg)  BMI 20.30 kg/m2 Wt Readings from Last 3 Encounters:  03/01/15 111 lb (50.349 kg)  12/21/14 114 lb (51.71 kg)  11/10/14 116 lb (52.617 kg)    General Appearance: Well nourished well developed, in no apparent distress. Eyes: PERRLA, EOMs, conjunctiva no swelling or erythema ENT/Mouth: Ear canals normal without obstruction, swelling, erythma, discharge.  TMs normal bilaterally.  Oropharynx moist, clear, without exudate, or postoropharyngeal swelling. Neck: Supple, thyroid normal,no cervical adenopathy  Respiratory: Respiratory effort normal, Breath sounds clear A&P without rhonchi, wheeze, or rale.  No retractions, no accessory usage. Cardio: RRR with no MRGs. Brisk peripheral pulses without edema.  Abdomen: Soft, + BS,  Non tender, no guarding, rebound, hernias, masses. Musculoskeletal: Full ROM, 5/5 strength, Normal gait Skin: Warm, dry without rashes, lesions, ecchymosis.  Neuro: Awake and oriented X 3, Cranial nerves intact. Normal muscle tone, no cerebellar symptoms. Psych: Normal affect, Insight and Judgment appropriate.    Starlyn Skeans, PA-C 9:46 AM [facility]

## 2015-03-02 LAB — INSULIN, RANDOM: Insulin: 6.5 u[IU]/mL (ref 2.0–19.6)

## 2015-03-02 LAB — VITAMIN D 25 HYDROXY (VIT D DEFICIENCY, FRACTURES): Vit D, 25-Hydroxy: 36 ng/mL (ref 30–100)

## 2015-03-02 LAB — HEMOGLOBIN A1C
HEMOGLOBIN A1C: 5.4 % (ref <5.7)
MEAN PLASMA GLUCOSE: 108 mg/dL (ref <117)

## 2015-03-08 ENCOUNTER — Other Ambulatory Visit: Payer: Self-pay | Admitting: Obstetrics and Gynecology

## 2015-03-09 LAB — CYTOLOGY - PAP

## 2015-03-23 ENCOUNTER — Other Ambulatory Visit: Payer: Self-pay | Admitting: Internal Medicine

## 2015-04-08 ENCOUNTER — Other Ambulatory Visit: Payer: Self-pay | Admitting: Internal Medicine

## 2015-06-08 ENCOUNTER — Ambulatory Visit (INDEPENDENT_AMBULATORY_CARE_PROVIDER_SITE_OTHER): Payer: 59 | Admitting: Internal Medicine

## 2015-06-08 ENCOUNTER — Encounter: Payer: Self-pay | Admitting: Internal Medicine

## 2015-06-08 VITALS — BP 102/64 | HR 58 | Temp 98.2°F | Resp 16 | Ht 62.0 in | Wt 108.0 lb

## 2015-06-08 DIAGNOSIS — E785 Hyperlipidemia, unspecified: Secondary | ICD-10-CM

## 2015-06-08 DIAGNOSIS — Z79899 Other long term (current) drug therapy: Secondary | ICD-10-CM

## 2015-06-08 DIAGNOSIS — F419 Anxiety disorder, unspecified: Secondary | ICD-10-CM

## 2015-06-08 DIAGNOSIS — E559 Vitamin D deficiency, unspecified: Secondary | ICD-10-CM | POA: Diagnosis not present

## 2015-06-08 NOTE — Progress Notes (Signed)
Patient ID: Kayla Ware, female   DOB: 04-23-1961, 54 y.o.   MRN: [id number]  Assessment and Plan:  Hypertension:  -Continue medication,  -monitor blood pressure at home.  -Continue DASH diet.   -Reminder to go to the ER if any CP, SOB, nausea, dizziness, severe HA, changes vision/speech, left arm numbness and tingling, and jaw pain.  Cholesterol: -Continue diet and exercise.     Pre-diabetes: -Continue diet and exercise.    Vitamin D Def: -continue medications.     Continue diet and meds as discussed. Further disposition pending results of labs.  HPI 54 y.o. female  presents for 3 month follow up with hypertension, hyperlipidemia, prediabetes and vitamin D.   Her blood pressure has been controlled at home, today their BP is BP: 102/64 mmHg.   She does workout. She denies chest pain, shortness of breath, dizziness.   She is on cholesterol medication and denies myalgias. Her cholesterol is at goal. The cholesterol last visit was:   Lab Results  Component Value Date   CHOL 204* 03/01/2015   HDL 58 03/01/2015   LDLCALC 121 03/01/2015   TRIG 126 03/01/2015   CHOLHDL 3.5 03/01/2015     She has been working on diet and exercise for prediabetes, and denies foot ulcerations, hyperglycemia, hypoglycemia , increased appetite, nausea, paresthesia of the feet, polydipsia, polyuria, visual disturbances, vomiting and weight loss. Last A1C in the office was:  Lab Results  Component Value Date   HGBA1C 5.4 03/01/2015    Patient is on Vitamin D supplement.  Lab Results  Component Value Date   VD25OH 36 03/01/2015     She reports that panic and hot flashes appear to be doing a little better.  She is happy with her medication.   Current Medications:  Current Outpatient Prescriptions on File Prior to Visit  Medication Sig Dispense Refill  . ALPRAZolam (XANAX) 1 MG tablet TAKE ONE TABLET BY MOUTH TWICE DAILY AS NEEDED 60 tablet 1  . aspirin 81 MG tablet Take 81 mg by mouth  daily.    . B Complex Vitamins (VITAMIN-B COMPLEX PO) Take by mouth daily.    . Flaxseed, Linseed, (FLAXSEED OIL PO) Take by mouth daily.    Marland Kitchen omeprazole (PRILOSEC) 20 MG capsule Take 20 mg by mouth daily as needed.    . sertraline (ZOLOFT) 50 MG tablet TAKE ONE TABLET BY MOUTH ONCE DAILY 30 tablet 3  . Vitamin D, Cholecalciferol, 1000 UNITS TABS Take 5,000 Units by mouth daily.      No current facility-administered medications on file prior to visit.    Medical History:  Past Medical History  Diagnosis Date  . Anxiety   . Heart murmur   . Hyperlipidemia   . Depression   . Gall stones   . Unspecified vitamin D deficiency     Allergies:  Allergies  Allergen Reactions  . Citalopram Other (See Comments)    FATIGUE   . Erythromycin Nausea And Vomiting  . Meloxicam Other (See Comments)    GI UPSET  . Propofol Other (See Comments)    TINNITUS     Review of Systems:  Review of Systems  Constitutional: Negative for fever, chills and malaise/fatigue.  HENT: Negative for congestion, ear pain and sore throat.   Eyes: Negative.   Respiratory: Negative for cough, shortness of breath and wheezing.   Cardiovascular: Negative for chest pain, palpitations and leg swelling.  Gastrointestinal: Positive for heartburn. Negative for nausea, vomiting, diarrhea, constipation, blood in stool  and melena.  Genitourinary: Negative.   Neurological: Negative for dizziness, sensory change, seizures and headaches.  Psychiatric/Behavioral: Negative for depression. The patient is not nervous/anxious and does not have insomnia.     Family history- Review and unchanged  Social history- Review and unchanged  Physical Exam: BP 102/64 mmHg  Pulse 58  Temp(Src) 98.2 F (36.8 C) (Temporal)  Resp 16  Ht 5\' 2"  (1.575 m)  Wt 108 lb (48.988 kg)  BMI 19.75 kg/m2 Wt Readings from Last 3 Encounters:  06/08/15 108 lb (48.988 kg)  03/01/15 111 lb (50.349 kg)  12/21/14 114 lb (51.71 kg)    General  Appearance: Well nourished well developed, in no apparent distress. Eyes: PERRLA, EOMs, conjunctiva no swelling or erythema ENT/Mouth: Ear canals normal without obstruction, swelling, erythma, discharge.  TMs normal bilaterally.  Oropharynx moist, clear, without exudate, or postoropharyngeal swelling. Neck: Supple, thyroid normal,no cervical adenopathy  Respiratory: Respiratory effort normal, Breath sounds clear A&P without rhonchi, wheeze, or rale.  No retractions, no accessory usage. Cardio: RRR with no MRGs. Brisk peripheral pulses without edema.  Abdomen: Soft, + BS,  Non tender, no guarding, rebound, hernias, masses. Musculoskeletal: Full ROM, 5/5 strength, Normal gait Skin: Warm, dry without rashes, lesions, ecchymosis.  Neuro: Awake and oriented X 3, Cranial nerves intact. Normal muscle tone, no cerebellar symptoms. Psych: Normal affect, Insight and Judgment appropriate.    Starlyn Skeans, PA-C 9:26 AM [facility]

## 2015-07-07 ENCOUNTER — Other Ambulatory Visit: Payer: Self-pay | Admitting: Physician Assistant

## 2015-07-07 DIAGNOSIS — F411 Generalized anxiety disorder: Secondary | ICD-10-CM

## 2015-07-23 ENCOUNTER — Other Ambulatory Visit: Payer: Self-pay | Admitting: Internal Medicine

## 2015-08-06 ENCOUNTER — Encounter: Payer: Self-pay | Admitting: Internal Medicine

## 2015-08-16 ENCOUNTER — Encounter: Payer: Self-pay | Admitting: Internal Medicine

## 2015-10-04 ENCOUNTER — Other Ambulatory Visit: Payer: Self-pay | Admitting: Physician Assistant

## 2015-10-06 ENCOUNTER — Encounter: Payer: Self-pay | Admitting: Internal Medicine

## 2015-11-10 ENCOUNTER — Encounter: Payer: Self-pay | Admitting: Internal Medicine

## 2015-11-10 ENCOUNTER — Ambulatory Visit (INDEPENDENT_AMBULATORY_CARE_PROVIDER_SITE_OTHER): Payer: BLUE CROSS/BLUE SHIELD | Admitting: Internal Medicine

## 2015-11-10 VITALS — BP 90/58 | HR 52 | Temp 98.0°F | Resp 16 | Ht 62.0 in | Wt 105.0 lb

## 2015-11-10 DIAGNOSIS — E559 Vitamin D deficiency, unspecified: Secondary | ICD-10-CM | POA: Diagnosis not present

## 2015-11-10 DIAGNOSIS — Z13220 Encounter for screening for lipoid disorders: Secondary | ICD-10-CM

## 2015-11-10 DIAGNOSIS — Z1389 Encounter for screening for other disorder: Secondary | ICD-10-CM

## 2015-11-10 DIAGNOSIS — Z131 Encounter for screening for diabetes mellitus: Secondary | ICD-10-CM

## 2015-11-10 DIAGNOSIS — Z0000 Encounter for general adult medical examination without abnormal findings: Secondary | ICD-10-CM | POA: Diagnosis not present

## 2015-11-10 DIAGNOSIS — Z130 Encounter for screening for diseases of the blood and blood-forming organs and certain disorders involving the immune mechanism: Secondary | ICD-10-CM

## 2015-11-10 DIAGNOSIS — Z1329 Encounter for screening for other suspected endocrine disorder: Secondary | ICD-10-CM

## 2015-11-10 DIAGNOSIS — Z79899 Other long term (current) drug therapy: Secondary | ICD-10-CM

## 2015-11-10 DIAGNOSIS — Z136 Encounter for screening for cardiovascular disorders: Secondary | ICD-10-CM

## 2015-11-10 LAB — CBC WITH DIFFERENTIAL/PLATELET
BASOS PCT: 0 %
Basophils Absolute: 0 cells/uL (ref 0–200)
EOS PCT: 1 %
Eosinophils Absolute: 38 cells/uL (ref 15–500)
HCT: 41.8 % (ref 35.0–45.0)
HEMOGLOBIN: 13.5 g/dL (ref 11.7–15.5)
Lymphocytes Relative: 36 %
Lymphs Abs: 1368 cells/uL (ref 850–3900)
MCH: 29 pg (ref 27.0–33.0)
MCHC: 32.3 g/dL (ref 32.0–36.0)
MCV: 89.9 fL (ref 80.0–100.0)
MPV: 9.8 fL (ref 7.5–12.5)
Monocytes Absolute: 342 cells/uL (ref 200–950)
Monocytes Relative: 9 %
Neutro Abs: 2052 cells/uL (ref 1500–7800)
Neutrophils Relative %: 54 %
Platelets: 203 10*3/uL (ref 140–400)
RBC: 4.65 MIL/uL (ref 3.80–5.10)
RDW: 13.7 % (ref 11.0–15.0)
WBC: 3.8 10*3/uL (ref 3.8–10.8)

## 2015-11-10 LAB — BASIC METABOLIC PANEL WITH GFR
BUN: 10 mg/dL (ref 7–25)
CALCIUM: 9.1 mg/dL (ref 8.6–10.4)
CO2: 24 mmol/L (ref 20–31)
Chloride: 106 mmol/L (ref 98–110)
Creat: 0.63 mg/dL (ref 0.50–1.05)
GFR, Est African American: >89 mL/min (ref >=60)
Glucose, Bld: 89 mg/dL (ref 65–99)
POTASSIUM: 3.9 mmol/L (ref 3.5–5.3)
SODIUM: 140 mmol/L (ref 135–146)

## 2015-11-10 LAB — HEPATIC FUNCTION PANEL
ALK PHOS: 86 U/L (ref 33–130)
ALT: 11 U/L (ref 6–29)
AST: 19 U/L (ref 10–35)
Albumin: 4 g/dL (ref 3.6–5.1)
BILIRUBIN DIRECT: 0.1 mg/dL (ref <=0.2)
BILIRUBIN TOTAL: 0.6 mg/dL (ref 0.2–1.2)
Indirect Bilirubin: 0.5 mg/dL (ref 0.2–1.2)
Total Protein: 6.7 g/dL (ref 6.1–8.1)

## 2015-11-10 LAB — LIPID PANEL
CHOL/HDL RATIO: 3.4 ratio (ref <=5.0)
Cholesterol: 169 mg/dL (ref 125–200)
HDL: 49 mg/dL (ref >=46)
LDL Cholesterol: 106 mg/dL (ref <130)
Triglycerides: 68 mg/dL (ref <150)
VLDL: 14 mg/dL (ref <30)

## 2015-11-10 LAB — IRON AND TIBC
%SAT: 40 % (ref 11–50)
Iron: 104 ug/dL (ref 45–160)
TIBC: 257 ug/dL (ref 250–450)
UIBC: 153 ug/dL (ref 125–400)

## 2015-11-10 LAB — HEMOGLOBIN A1C
HEMOGLOBIN A1C: 5.6 % (ref <5.7)
Mean Plasma Glucose: 114 mg/dL

## 2015-11-10 LAB — VITAMIN B12: VITAMIN B 12: 429 pg/mL (ref 200–1100)

## 2015-11-10 LAB — MAGNESIUM: Magnesium: 2.1 mg/dL (ref 1.5–2.5)

## 2015-11-10 LAB — TSH: TSH: 1.6 m[IU]/L

## 2015-11-10 NOTE — Progress Notes (Signed)
Complete Physical  Assessment and Plan:   1. Routine general medical examination at a health care facility  - CBC with Differential/Platelet - BASIC METABOLIC PANEL WITH GFR - Hepatic function panel - Magnesium  2. Screening for hyperlipidemia  - Lipid panel  3. Screening for diabetes mellitus - Hemoglobin A1c - Insulin, random  4. Screening for deficiency anemia  - Iron and TIBC - Vitamin B12  5. Screening for hematuria or proteinuria  - Urinalysis, Routine w reflex microscopic (not at [facility]) - Microalbumin / creatinine urine ratio  6. Screening for cardiovascular condition  - EKG 12-Lead - [place], RETROPERITNL ABD,  LTD  7. Vitamin D deficiency  - VITAMIN D 25 Hydroxy (Vit-D Deficiency, Fractures)  8. Screening for thyroid disorder  - TSH  Discussed med's effects and SE's. Screening labs and tests as requested with regular follow-up as recommended.  HPI  55 y.o. female  presents for a complete physical.  Her blood pressure has been controlled at home, today their BP is BP: (!) 90/58 mmHg.  She does workout. She denies chest pain, shortness of breath, dizziness.   She is on cholesterol medication and denies myalgias. Her cholesterol is at goal. The cholesterol last visit was:  Lab Results  Component Value Date   CHOL 204* 03/01/2015   HDL 58 03/01/2015   LDLCALC 121 03/01/2015   TRIG 126 03/01/2015   CHOLHDL 3.5 03/01/2015  .  She has been working on diet and exercise for prediabetes, she is on bASA, she is not on ACE/ARB and denies foot ulcerations, hyperglycemia, hypoglycemia , increased appetite, nausea, paresthesia of the feet, polydipsia, polyuria, visual disturbances, vomiting and weight loss. Last A1C in the office was:  Lab Results  Component Value Date   HGBA1C 5.4 03/01/2015    Patient is on Vitamin D supplement.   Lab Results  Component Value Date   VD25OH 36 03/01/2015     She has been having some issues with bilateral knee pain and also  right foot pain near the head of the 5th metatarsal.  She reports that she has been having aching pain which was been going on x 6 months.  She occasionally uses advil which does help a little bit.    She has not seen Dr. Runell Gess in a while.  She is post menopausal.  She has no history of abnormal pap smears.  Her last one was done in 2014.    Current Medications:  Current Outpatient Prescriptions on File Prior to Visit  Medication Sig Dispense Refill  . ALPRAZolam (XANAX) 1 MG tablet TAKE ONE TABLET BY MOUTH TWICE DAILY AS NEEDED 60 tablet 3  . aspirin 81 MG tablet Take 81 mg by mouth daily.    . B Complex Vitamins (VITAMIN-B COMPLEX PO) Take by mouth daily.    . Flaxseed, Linseed, (FLAXSEED OIL PO) Take by mouth daily.    Marland Kitchen omeprazole (PRILOSEC) 20 MG capsule Take 20 mg by mouth daily as needed.    . sertraline (ZOLOFT) 50 MG tablet TAKE ONE TABLET BY MOUTH ONCE DAILY 90 tablet 1  . Vitamin D, Cholecalciferol, 1000 UNITS TABS Take 5,000 Units by mouth daily.      No current facility-administered medications on file prior to visit.    Health Maintenance:   Immunization History  Administered Date(s) Administered  . Tdap 10/28/2012    Tetanus: 2014 LMP: remote Pap: 2014 declines it today MGM:2016 Colonoscopy: 2014 Last Dental Exam: Dr. Orvil Feil, 2017 Last Eye Exam:  Performed  2 months ago.  No issues  Patient Care Team: Unk Pinto, MD as PCP - General (Internal Medicine) Jerrell Belfast, MD as Consulting Physician (Otolaryngology) Dian Queen, MD as Consulting Physician (Obstetrics and Gynecology)  Allergies:  Allergies  Allergen Reactions  . Citalopram Other (See Comments)    FATIGUE   . Erythromycin Nausea And Vomiting  . Meloxicam Other (See Comments)    GI UPSET  . Propofol Other (See Comments)    TINNITUS    Medical History:  Past Medical History  Diagnosis Date  . Anxiety   . Heart murmur   . Hyperlipidemia   . Depression   . Gall stones   .  Unspecified vitamin D deficiency     Surgical History:  Past Surgical History  Procedure Laterality Date  . None      Family History:  Family History  Problem Relation Age of Onset  . Colon polyps Father   . Hyperlipidemia Father   . Depression Father   . Colon cancer Paternal Aunt 45  . Arthritis Brother     RHEUMATOID  . Cancer Maternal Aunt     breast, cervical  . Cancer Maternal Grandmother   . Birth defects Maternal Grandmother     breast  . Diabetes Paternal Grandmother     Social History:  Social History  Substance Use Topics  . Smoking status: Never Smoker   . Smokeless tobacco: Never Used  . Alcohol Use: 3.6 oz/week    6 Cans of beer per week    Review of Systems: Review of Systems  Constitutional: Negative for fever, chills and malaise/fatigue.  HENT: Negative for congestion, ear pain and sore throat.   Eyes: Negative.   Respiratory: Negative for cough, shortness of breath and wheezing.   Cardiovascular: Negative for chest pain, palpitations and leg swelling.  Gastrointestinal: Negative for heartburn, abdominal pain, diarrhea, constipation, blood in stool and melena.  Genitourinary: Negative.   Skin: Negative.   Neurological: Negative for dizziness, sensory change, loss of consciousness and headaches.  Psychiatric/Behavioral: Negative for depression. The patient is not nervous/anxious and does not have insomnia.     Physical Exam: Estimated body mass index is 19.2 kg/(m^2) as calculated from the following:   Height as of this encounter: 5\' 2"  (1.575 m).   Weight as of this encounter: 105 lb (47.628 kg). BP 90/58 mmHg  Pulse 52  Temp(Src) 98 F (36.7 C) (Temporal)  Resp 16  Ht 5\' 2"  (1.575 m)  Wt 105 lb (47.628 kg)  BMI 19.20 kg/m2  General Appearance: Well nourished well developed, in no apparent distress.  Eyes: PERRLA, EOMs, conjunctiva no swelling or erythema ENT/Mouth: Ear canals normal without obstruction, swelling, erythema, or  discharge.  TMs normal bilaterally with no erythema, bulging, retraction, or loss of landmark.  Oropharynx moist and clear with no exudate, erythema, or swelling.   Neck: Supple, thyroid normal. No bruits.  No cervical adenopathy Respiratory: Respiratory effort normal, Breath sounds clear A&P without wheeze, rhonchi, rales.   Cardio: RRR without murmurs, rubs or gallops. Brisk peripheral pulses without edema.  Chest: symmetric, with normal excursions Breasts: Symmetric, without lumps, nipple discharge, retractions.  Abdomen: Soft, nontender, no guarding, rebound, hernias, masses, or organomegaly.  Lymphatics: Non tender without lymphadenopathy.  Musculoskeletal: Full ROM all peripheral extremities,5/5 strength, and normal gait.  Skin: Warm, dry without rashes, lesions, ecchymosis. Neuro: Awake and oriented X 3, Cranial nerves intact, reflexes equal bilaterally. Normal muscle tone, no cerebellar symptoms. Sensation intact.  Psych:  normal  affect, Insight and Judgment appropriate.   EKG: WNL no changes.  AORTA SCAN: WNL   Over 40 minutes of exam, counseling, chart review and critical decision making was performed  Loma Sousa Forcucci 10:14 AM [facility]

## 2015-11-11 LAB — URINALYSIS, ROUTINE W REFLEX MICROSCOPIC
Bilirubin Urine: NEGATIVE
Glucose, UA: NEGATIVE
HGB URINE DIPSTICK: NEGATIVE
Ketones, ur: NEGATIVE
NITRITE: NEGATIVE
PROTEIN: NEGATIVE
Specific Gravity, Urine: 1.016 (ref 1.001–1.035)
pH: 6 (ref 5.0–8.0)

## 2015-11-11 LAB — URINALYSIS, MICROSCOPIC ONLY
BACTERIA UA: NONE SEEN [HPF]
Casts: NONE SEEN [LPF]
Crystals: NONE SEEN [HPF]
RBC / HPF: NONE SEEN RBC/HPF (ref <=2)
Yeast: NONE SEEN [HPF]

## 2015-11-11 LAB — MICROALBUMIN / CREATININE URINE RATIO
Creatinine, Urine: 85 mg/dL (ref 20–320)
MICROALB UR: 0.3 mg/dL
MICROALB/CREAT RATIO: 4 ug/mg{creat} (ref <30)

## 2015-11-11 LAB — VITAMIN D 25 HYDROXY (VIT D DEFICIENCY, FRACTURES): Vit D, 25-Hydroxy: 59 ng/mL (ref 30–100)

## 2015-11-11 LAB — INSULIN, RANDOM: Insulin: 4.1 u[IU]/mL (ref 2.0–19.6)

## 2015-11-24 ENCOUNTER — Other Ambulatory Visit: Payer: Self-pay | Admitting: Internal Medicine

## 2015-11-24 DIAGNOSIS — M79673 Pain in unspecified foot: Secondary | ICD-10-CM

## 2015-11-24 DIAGNOSIS — M25569 Pain in unspecified knee: Secondary | ICD-10-CM

## 2016-01-17 ENCOUNTER — Other Ambulatory Visit: Payer: Self-pay | Admitting: Internal Medicine

## 2016-01-17 ENCOUNTER — Other Ambulatory Visit: Payer: Self-pay | Admitting: Podiatry

## 2016-01-17 ENCOUNTER — Ambulatory Visit (INDEPENDENT_AMBULATORY_CARE_PROVIDER_SITE_OTHER): Payer: BLUE CROSS/BLUE SHIELD

## 2016-01-17 ENCOUNTER — Encounter: Payer: Self-pay | Admitting: Podiatry

## 2016-01-17 ENCOUNTER — Ambulatory Visit (INDEPENDENT_AMBULATORY_CARE_PROVIDER_SITE_OTHER): Payer: BLUE CROSS/BLUE SHIELD | Admitting: Podiatry

## 2016-01-17 VITALS — BP 108/67 | HR 56 | Resp 12

## 2016-01-17 DIAGNOSIS — M722 Plantar fascial fibromatosis: Secondary | ICD-10-CM | POA: Diagnosis not present

## 2016-01-17 DIAGNOSIS — M79671 Pain in right foot: Secondary | ICD-10-CM

## 2016-01-17 DIAGNOSIS — R52 Pain, unspecified: Secondary | ICD-10-CM

## 2016-01-17 DIAGNOSIS — F419 Anxiety disorder, unspecified: Secondary | ICD-10-CM

## 2016-01-17 MED ORDER — TRIAMCINOLONE ACETONIDE 10 MG/ML IJ SUSP
10.0000 mg | Freq: Once | INTRAMUSCULAR | Status: AC
  Administered 2016-01-17: 10 mg

## 2016-01-17 MED ORDER — PREDNISONE 10 MG PO TABS
ORAL_TABLET | ORAL | Status: DC
Start: 2016-01-17 — End: 2016-05-15

## 2016-01-17 NOTE — Patient Instructions (Signed)

## 2016-01-17 NOTE — Progress Notes (Signed)
Subjective:     Patient ID: Kayla Ware, female   DOB: 29-Dec-1960, 55 y.o.   MRN: [id number]  HPI patient presents stating I have a lot of pain in my feet and legs and it seems that it started in my feet a couple years ago. Also has history of sciatica and works 6 days a week on her feet all day   Review of Systems  All other systems reviewed and are negative.      Objective:   Physical Exam  Constitutional: She is oriented to person, place, and time.  Cardiovascular: Intact distal pulses.   Musculoskeletal: Normal range of motion.  Neurological: She is oriented to person, place, and time.  Skin: Skin is warm.  Nursing note and vitals reviewed.  neurovascular status intact muscle strength adequate range of motion within normal limits with a fairly diminished plantar fat pad bilateral and narrow heels bilateral with quite a bit of plantar discomfort and also discomfort along the peroneal complex right over left and pain behind the ankle right with no current indications of muscle loss or strength issues. Patient's found have good digital perfusion and is well oriented 3     Assessment:     Inflammatory condition with probability of plantar fasciitis creating change in gait leading to other pathology    Plan:     H&P and x-rays reviewed of both feet. Today I went ahead and I injected the plantar fascia bilateral 3 mg Kenalog 5 mill grams Xylocaine and applied fascial brace and placed on sterile prep DS 12 day Dosepak. Reappoint in 2 weeks to reevaluate  X-rays indicated no spur formation with minimal depression of the arch and no indications of stress fracture or advanced arthritis

## 2016-01-17 NOTE — Progress Notes (Signed)
   Subjective:    Patient ID: Kayla Ware, female    DOB: March 17, 1961, 55 y.o.   MRN: [id number]  HPI  Chief Complaint  Patient presents with  . Foot Pain    ''B/L FEET AND HEEL ARE SORE.''    PT STATED B/L FEET AND HEEL BEEN HURTING AND SOMETIMES BONE POPPING. FEET GET WORSE ESPECIALLY WHEN STANDING/WALKING. TRIED NO TREATMENT. Review of Systems  Musculoskeletal: Positive for gait problem.       Objective:   Physical Exam        Assessment & Plan:

## 2016-01-18 ENCOUNTER — Other Ambulatory Visit: Payer: Self-pay | Admitting: Internal Medicine

## 2016-02-03 ENCOUNTER — Ambulatory Visit: Payer: BLUE CROSS/BLUE SHIELD | Admitting: Podiatry

## 2016-05-15 ENCOUNTER — Ambulatory Visit (INDEPENDENT_AMBULATORY_CARE_PROVIDER_SITE_OTHER): Payer: BLUE CROSS/BLUE SHIELD | Admitting: Internal Medicine

## 2016-05-15 ENCOUNTER — Encounter: Payer: Self-pay | Admitting: Internal Medicine

## 2016-05-15 VITALS — BP 110/60 | HR 82 | Temp 97.8°F | Resp 16 | Wt 108.0 lb

## 2016-05-15 DIAGNOSIS — M722 Plantar fascial fibromatosis: Secondary | ICD-10-CM | POA: Diagnosis not present

## 2016-05-15 DIAGNOSIS — B349 Viral infection, unspecified: Secondary | ICD-10-CM

## 2016-05-15 DIAGNOSIS — F419 Anxiety disorder, unspecified: Secondary | ICD-10-CM | POA: Diagnosis not present

## 2016-05-15 MED ORDER — ALPRAZOLAM 1 MG PO TABS
ORAL_TABLET | ORAL | 1 refills | Status: DC
Start: 2016-05-15 — End: 2016-05-15

## 2016-05-15 MED ORDER — ALPRAZOLAM 1 MG PO TABS: ORAL_TABLET | ORAL | 1 refills | Status: DC

## 2016-05-15 MED ORDER — DICLOFENAC SODIUM 1 % TD GEL: 4.0000 g | Freq: Four times a day (QID) | TRANSDERMAL | Status: DC

## 2016-05-15 MED ORDER — DEXAMETHASONE SODIUM PHOSPHATE 10 MG/ML IJ SOLN: 10.0000 mg | Freq: Once | INTRAMUSCULAR | Status: DC

## 2016-05-15 NOTE — Progress Notes (Signed)
Assessment and Plan:  Plantar Fasciitis -voltaren gel  Generalized anxiety -xanax called in by me personally  Viral Syndrome -decadron given here -recommended trying to use flonase   Continue diet and meds as discussed. Further disposition pending results of labs.  HPI 55 y.o. female  presents for 3 month follow up for severe anxiety requiring xanax and plantar fasciitis.   She reports that she did go to podiatry and had bilateral injections in her feet.  It helped for a short period of time and then stopped helping.  Her feet are still really painful.  She is on them all day.  She is wearing some sleeves on her ankles but this doesn't help much.    She has not been taking xanax very regularly.  She reports that her xanax is very stable.      She reports that she did have headaches, fever, chills, and severe fatigue.  She reports that she had no other symptoms.    Current Medications:  Current Outpatient Prescriptions on File Prior to Visit  Medication Sig Dispense Refill  . ALPRAZolam (XANAX) 1 MG tablet Take 1/2 to 1 Tablet 2 x / day if needed for anxiety 180 tablet 1   No current facility-administered medications on file prior to visit.     Medical History:  Past Medical History:  Diagnosis Date  . Anxiety   . Depression   . Gall stones   . Heart murmur   . Hyperlipidemia   . Unspecified vitamin D deficiency     Allergies:  Allergies  Allergen Reactions  . Citalopram Other (See Comments)    FATIGUE   . Erythromycin Nausea And Vomiting  . Meloxicam Other (See Comments)    GI UPSET  . Propofol Other (See Comments)    TINNITUS     Review of Systems:  Review of Systems  Constitutional: Negative for chills, fever and malaise/fatigue.  HENT: Negative for congestion, ear pain and sore throat.   Eyes: Negative.   Respiratory: Negative for cough, shortness of breath and wheezing.   Cardiovascular: Negative for chest pain, palpitations and leg swelling.   Gastrointestinal: Negative for abdominal pain, blood in stool, constipation, diarrhea, heartburn and melena.  Genitourinary: Negative.   Skin: Negative.   Neurological: Negative for dizziness, sensory change, loss of consciousness and headaches.  Psychiatric/Behavioral: Negative for depression. The patient is not nervous/anxious and does not have insomnia.     Family history- Review and unchanged  Social history- Review and unchanged  Physical Exam: BP 110/60   Pulse 82   Temp 97.8 F (36.6 C)   Resp 16   Wt 108 lb (49 kg)   SpO2 96%   BMI 19.75 kg/m  Wt Readings from Last 3 Encounters:  05/15/16 108 lb (49 kg)  11/10/15 105 lb (47.6 kg)  06/08/15 108 lb (49 kg)    General Appearance: Well nourished well developed, in no apparent distress. Eyes: PERRLA, EOMs, conjunctiva no swelling or erythema ENT/Mouth: Ear canals normal without obstruction, swelling, erythma, discharge.  TMs normal bilaterally.  Oropharynx moist, clear, without exudate, or postoropharyngeal swelling. Neck: Supple, thyroid normal,no cervical adenopathy  Respiratory: Respiratory effort normal, Breath sounds clear A&P without rhonchi, wheeze, or rale.  No retractions, no accessory usage. Cardio: RRR with no MRGs. Brisk peripheral pulses without edema.  Abdomen: Soft, + BS,  Non tender, no guarding, rebound, hernias, masses. Musculoskeletal: Full ROM, 5/5 strength, Normal gait Skin: Warm, dry without rashes, lesions, ecchymosis.  Neuro: Awake and oriented  X 3, Cranial nerves intact. Normal muscle tone, no cerebellar symptoms. Psych: Normal affect, Insight and Judgment appropriate.    Starlyn Skeans, PA-C 9:08 AM [facility]

## 2016-05-17 ENCOUNTER — Other Ambulatory Visit: Payer: Self-pay | Admitting: *Deleted

## 2016-05-17 MED ORDER — DICLOFENAC SODIUM 1 % TD GEL: 4.0000 g | Freq: Four times a day (QID) | TRANSDERMAL | 0 refills | Status: DC

## 2016-08-11 ENCOUNTER — Encounter: Payer: Self-pay | Admitting: Internal Medicine

## 2016-08-11 ENCOUNTER — Ambulatory Visit (INDEPENDENT_AMBULATORY_CARE_PROVIDER_SITE_OTHER): Payer: BLUE CROSS/BLUE SHIELD | Admitting: Internal Medicine

## 2016-08-11 VITALS — BP 102/58 | HR 72 | Temp 98.2°F | Resp 16 | Ht 62.0 in

## 2016-08-11 DIAGNOSIS — J069 Acute upper respiratory infection, unspecified: Secondary | ICD-10-CM

## 2016-08-11 DIAGNOSIS — H6121 Impacted cerumen, right ear: Secondary | ICD-10-CM

## 2016-08-11 MED ORDER — FLUTICASONE PROPIONATE 50 MCG/ACT NA SUSP: 2.0000 | Freq: Every day | NASAL | 0 refills | Status: DC

## 2016-08-11 MED ORDER — DEXAMETHASONE SODIUM PHOSPHATE 10 MG/ML IJ SOLN
10.0000 mg | Freq: Once | INTRAMUSCULAR | Status: AC
  Administered 2016-08-11: 10 mg via INTRAMUSCULAR

## 2016-08-11 MED ORDER — DULOXETINE HCL 30 MG PO CPEP: 30.0000 mg | ORAL_CAPSULE | Freq: Every day | ORAL | 2 refills | Status: DC

## 2016-08-11 NOTE — Patient Instructions (Signed)

## 2016-08-11 NOTE — Progress Notes (Signed)
HPI  Patient presents to the office for evaluation of sore throat and right ear pain.  It has been going on for 1 weeks.  Patient reports minimal cough.  They also endorse change in voice, postnasal drip and runny nose, clear rhinorrhea, right ear pain.  .  They have tried tylenol cold and sinus, and a mentholated nasal spray..  They report that nothing has worked.  They denies other sick contacts.  She also notes that her xanax is no longer working for her anxiety.  She has taken paxil and citalopram without relief in the past.   Review of Systems  Constitutional: Positive for malaise/fatigue. Negative for chills and fever.  HENT: Positive for congestion, ear pain, hearing loss and sore throat.   Respiratory: Positive for cough. Negative for sputum production, shortness of breath and wheezing.   Cardiovascular: Negative for chest pain, palpitations and leg swelling.  Neurological: Positive for headaches.    PE:  Vitals:   08/11/16 1139  BP: (!) 102/58  Pulse: 72  Resp: 16  Temp: 98.2 F (36.8 C)    General:  Alert and non-toxic, WDWN, NAD HEENT: NCAT, PERLA, EOM normal, no occular discharge or erythema.  Nasal mucosal edema with sinus tenderness to palpation.  Oropharynx clear with minimal oropharyngeal edema and erythema.  Mucous membranes moist and pink. Neck:  Cervical adenopathy Chest:  RRR no MRGs.  Lungs clear to auscultation A&P with no wheezes rhonchi or rales.   Abdomen: +BS x 4 quadrants, soft, non-tender, no guarding, rigidity, or rebound. Skin: warm and dry no rash Neuro: A&Ox4, CN II-XII grossly intact  Assessment and Plan:   1. Acute URI -flonase -daily antishistamine -drink plenty of water - dexamethasone (DECADRON) injection 10 mg; Inject 1 mL (10 mg total) into the muscle once.  2.  Acute anxiety -patient told that xanax is meant to be used only as rescue medication -will try cymbalta 30 mg qd -recheck in 4-6 weeks -patient to call if side effects of  medications.

## 2016-08-14 ENCOUNTER — Telehealth: Payer: Self-pay | Admitting: Internal Medicine

## 2016-08-14 NOTE — Addendum Note (Signed)
Addended by: Starlyn Skeans A on: 08/14/2016 02:09 PM   Modules accepted: Miquel Dunn

## 2016-08-15 ENCOUNTER — Ambulatory Visit (INDEPENDENT_AMBULATORY_CARE_PROVIDER_SITE_OTHER): Payer: BLUE CROSS/BLUE SHIELD | Admitting: Internal Medicine

## 2016-08-15 ENCOUNTER — Encounter: Payer: Self-pay | Admitting: Internal Medicine

## 2016-08-15 VITALS — BP 110/60 | HR 78 | Temp 97.8°F | Resp 16 | Ht 62.0 in

## 2016-08-15 DIAGNOSIS — T887XXA Unspecified adverse effect of drug or medicament, initial encounter: Secondary | ICD-10-CM | POA: Diagnosis not present

## 2016-08-15 NOTE — Progress Notes (Signed)
Assessment and Plan:   1. Non-dose-related adverse effect of medication, initial encounter -likely had poor reaction to stopping xanax 1 mg TID rapidly and new start of cymbalta. -ER visit reviewed and no acute abnormalities on labs or CT head -patient reassured -restart xanax   HPI 56 y.o.female presents for discussion of medications side effects.  Patient reports that she took cymbalta on Friday night.  Patient reports that since her office visit on Friday she developed fatigue, transient tingling, and numbness of the face and feet.  She was taken to the ER by her husband and had a full stroke workup and CT head and labs were normal.  Since being home she has been having some worsening.  She has had cortisone injections to the joints before without issues.  She reports that she feels like her legs are really stiff.  She reports that she has not been moving around a whole lot.  She reports that her stomach was knotting up. She was not taking her xanax.  She reports that she has not taken anything at all. She reports that she has been trying to drink milk and also to eat cheese.   Past Medical History:  Diagnosis Date  . Anxiety   . Depression   . Gall stones   . Heart murmur   . Hyperlipidemia   . Unspecified vitamin D deficiency      Allergies  Allergen Reactions  . Citalopram Other (See Comments)    FATIGUE   . Erythromycin Nausea And Vomiting  . Meloxicam Other (See Comments)    GI UPSET  . Propofol Other (See Comments)    TINNITUS      Current Outpatient Prescriptions on File Prior to Visit  Medication Sig Dispense Refill  . ALPRAZolam (XANAX) 1 MG tablet Take 1/2 to 1 Tablet 2 x / day if needed for anxiety (Patient not taking: Reported on 08/15/2016) 180 tablet 1   No current facility-administered medications on file prior to visit.     ROS: all negative except above.   Physical Exam: There were no vitals filed for this visit. BP 110/60   Pulse 78   Temp 97.8 F  (36.6 C) (Temporal)   Resp 16   Ht 5\' 2"  (1.575 m)  General Appearance: Well developed well nourished, non-toxic appearing in no apparent distress. Eyes: PERRLA, EOMs, conjunctiva w/ no swelling or erythema or discharge Sinuses: No Frontal/maxillary tenderness ENT/Mouth: Ear canals clear without swelling or erythema.  TM's normal bilaterally with no retractions, bulging, or loss of landmarks.   Neck: Supple, thyroid normal, no notable JVD  Respiratory: Respiratory effort normal, Clear breath sounds anteriorly and posteriorly bilaterally without rales, rhonchi, wheezing or stridor. No retractions or accessory muscle usage. Cardio: RRR with no MRGs.   Abdomen: Soft, + BS.  Non tender, no guarding, rebound, hernias, masses.  Musculoskeletal: Full ROM, 5/5 strength, normal gait.  Skin: Warm, dry without rashes  Neuro: Awake and oriented X 3, Cranial nerves intact. Normal muscle tone, no cerebellar symptoms. Sensation intact.  Psych: normal affect, Insight and Judgment appropriate.     Starlyn Skeans, PA-C 3:21 PM [facility]

## 2016-08-15 NOTE — Telephone Encounter (Signed)
Patient called reporting that she was sent to the hospital due to medication side effects from her recent visit.  Labs and CT scan at the ER reviewed. All within normal limits.  She was advised to stop the cymbalta.  If further issues she was told to come in.

## 2016-08-29 ENCOUNTER — Other Ambulatory Visit: Payer: Self-pay | Admitting: Internal Medicine

## 2016-08-29 MED ORDER — AZITHROMYCIN 250 MG PO TABS
ORAL_TABLET | ORAL | 0 refills | Status: DC
Start: 2016-08-29 — End: 2016-10-05

## 2016-08-29 MED ORDER — PROMETHAZINE-DM 6.25-15 MG/5ML PO SYRP: ORAL_SOLUTION | ORAL | 1 refills | Status: DC

## 2016-08-29 MED ORDER — FLUTICASONE PROPIONATE 50 MCG/ACT NA SUSP: 2.0000 | Freq: Every day | NASAL | 0 refills | Status: DC

## 2016-08-29 NOTE — Progress Notes (Signed)
Patient calling with 2 days of sinus pressure, fever, sore throat, and cough with green mucous production.  She is requesting we send in medications for her.  Will send in zpak, flonase, phenergan dm, and will recommend that she needs to take claritin, zyrtec or allegra.

## 2016-10-05 ENCOUNTER — Encounter: Payer: Self-pay | Admitting: Internal Medicine

## 2016-10-05 ENCOUNTER — Ambulatory Visit: Payer: BLUE CROSS/BLUE SHIELD | Admitting: Internal Medicine

## 2016-10-05 VITALS — BP 106/60 | HR 92 | Temp 98.2°F | Resp 14 | Ht 62.0 in

## 2016-10-05 DIAGNOSIS — F411 Generalized anxiety disorder: Secondary | ICD-10-CM

## 2016-10-05 NOTE — Progress Notes (Signed)
Patient presented to the office today to discuss her recent hospital bills. Patient saw me on 08/11/16 with ear pain and upper respiratory symptoms and was treated with a decadron shot and also told to take flonase and a daily antihistamine and to try to drink plenty of water.  She also mentioned at that visit that xanax 1 mg TID was no longer helping with her anxiety.  She was not on any type of SSRI or SNRI.  She had tried citalopram in the past which she had stopped due to fatigue.  I recommended that we try to use cymbalta to help with depression.  I discussed that xanax was initially meant to be used as a rescue medication and that this was not meant to be taken the way she was taking it and that we needed to see if we could cut back on some of this use.  She agreed to try to new medication.  She did try the cymbalta at home and stopped xanax completely.  She felt so bad over the weekend and was having weakness, numbness of the right foot, and lightheadedness.  She was taken by her husband to [place] [facility] where they stopped the cymbalta.  She did have blood work and a CT scan of her head and everything came back normal.  They asked her to follow-up back up with [place].  They were not aware that she had stopped her xanax completely and she was not taking anything.  She did follow back up in the office and I suspected that a lot of her issues of "feeling bad" were a combination of stopping xanax really quickly and maybe mild side effects of the cymbalta.  Patient is very upset that she has bills from [facility] and feels that I should contact somebody because in essence she does feel that this is my fault.  I have explained to her that there is nothing that I can do about the bills from [facility], and although I am very sorry that she had this happen that there is a risk to trying any new medication.  Her husband told the patient that she can either pay the bills or "get  a lawyer."  She reports that she would like to get off of xanax eventually and then see a neurologist as she can't seem to remember anything.  I apologized again for the inconvenience of the bills but that I felt that her treatment in the ER was correct medical treatment.  She will not be charged today for this conversation.

## 2016-10-17 ENCOUNTER — Encounter: Payer: Self-pay | Admitting: Internal Medicine

## 2016-10-17 ENCOUNTER — Ambulatory Visit (INDEPENDENT_AMBULATORY_CARE_PROVIDER_SITE_OTHER): Payer: BLUE CROSS/BLUE SHIELD | Admitting: Internal Medicine

## 2016-10-17 VITALS — BP 106/62 | HR 100 | Temp 98.6°F | Resp 14 | Ht 62.0 in | Wt 108.0 lb

## 2016-10-17 DIAGNOSIS — J069 Acute upper respiratory infection, unspecified: Secondary | ICD-10-CM

## 2016-10-17 DIAGNOSIS — F419 Anxiety disorder, unspecified: Secondary | ICD-10-CM | POA: Diagnosis not present

## 2016-10-17 MED ORDER — ALPRAZOLAM 1 MG PO TABS: ORAL_TABLET | ORAL | 1 refills | Status: DC

## 2016-10-17 MED ORDER — AZITHROMYCIN 250 MG PO TABS: ORAL_TABLET | ORAL | 0 refills | Status: DC

## 2016-10-17 NOTE — Progress Notes (Signed)
Assessment and Plan:  GAD -cont xanax -patient unwilling to try another medication  Acute uri -cont flonase -cont daily anthistamine -nasal saline -zpak -patient will not take decadron or prednisone  Continue diet and meds as discussed. Further disposition pending results of labs.  HPI 56 y.o. female  presents for 3 month follow-up of medication management.  Patient is on xanax TID daily for anxiety.  We have tried to put her on SSRI and SNRI without relief of anxiety.  She is not willing at this time to try anything else.      She reports that for the last 9 days she has had a bad head cold.  She reports that she is doing nasal saline and also taking flonase and a daily antihistamine.  She reports that she would like to do a mild abx.    She has no other complaints.     Current Medications:  Current Outpatient Prescriptions on File Prior to Visit  Medication Sig Dispense Refill  . ALPRAZolam (XANAX) 1 MG tablet Take 1/2 to 1 Tablet 2 x / day if needed for anxiety 180 tablet 1   No current facility-administered medications on file prior to visit.     Medical History:  Past Medical History:  Diagnosis Date  . Anxiety   . Depression   . Gall stones   . Heart murmur   . Hyperlipidemia   . Unspecified vitamin D deficiency     Allergies:  Allergies  Allergen Reactions  . Citalopram Other (See Comments)    FATIGUE   . Erythromycin Nausea And Vomiting  . Meloxicam Other (See Comments)    GI UPSET  . Propofol Other (See Comments)    TINNITUS     Review of Systems:  Review of Systems  Constitutional: Negative for chills, fever and malaise/fatigue.  HENT: Negative for congestion, ear pain and sore throat.   Eyes: Negative.   Respiratory: Negative for cough, shortness of breath and wheezing.   Cardiovascular: Negative for chest pain, palpitations and leg swelling.  Gastrointestinal: Negative for abdominal pain, blood in stool, constipation, diarrhea, heartburn and  melena.  Genitourinary: Negative.   Skin: Negative.   Neurological: Negative for dizziness, sensory change, loss of consciousness and headaches.  Psychiatric/Behavioral: Negative for depression. The patient is not nervous/anxious and does not have insomnia.     Family history- Review and unchanged  Social history- Review and unchanged  Physical Exam: BP 106/62   Pulse 100   Temp 98.6 F (37 C) (Temporal)   Resp 14   Ht 5\' 2"  (1.575 m)   Wt 108 lb (49 kg)   BMI 19.75 kg/m  Wt Readings from Last 3 Encounters:  10/17/16 108 lb (49 kg)  05/15/16 108 lb (49 kg)  11/10/15 105 lb (47.6 kg)    General Appearance: Well nourished well developed, in no apparent distress. Eyes: PERRLA, EOMs, conjunctiva no swelling or erythema ENT/Mouth: Ear canals normal without obstruction, swelling, erythma, discharge.  TMs normal bilaterally.  Oropharynx moist, clear, without exudate, or postoropharyngeal swelling. Neck: Supple, thyroid normal,no cervical adenopathy  Respiratory: Respiratory effort normal, Breath sounds clear A&P without rhonchi, wheeze, or rale.  No retractions, no accessory usage. Cardio: RRR with no MRGs. Brisk peripheral pulses without edema.  Abdomen: Soft, + BS,  Non tender, no guarding, rebound, hernias, masses. Musculoskeletal: Full ROM, 5/5 strength, Normal gait Skin: Warm, dry without rashes, lesions, ecchymosis.  Neuro: Awake and oriented X 3, Cranial nerves intact. Normal muscle tone, no cerebellar  symptoms. Psych: Normal affect, Insight and Judgment appropriate.    Starlyn Skeans, PA-C 9:04 AM [facility]

## 2016-11-06 ENCOUNTER — Ambulatory Visit (INDEPENDENT_AMBULATORY_CARE_PROVIDER_SITE_OTHER): Payer: BLUE CROSS/BLUE SHIELD | Admitting: Physician Assistant

## 2016-11-06 ENCOUNTER — Encounter: Payer: Self-pay | Admitting: Physician Assistant

## 2016-11-06 VITALS — BP 110/70 | HR 86 | Temp 98.1°F | Resp 16 | Ht 62.0 in | Wt 111.0 lb

## 2016-11-06 DIAGNOSIS — R413 Other amnesia: Secondary | ICD-10-CM

## 2016-11-06 DIAGNOSIS — F411 Generalized anxiety disorder: Secondary | ICD-10-CM

## 2016-11-06 NOTE — Patient Instructions (Addendum)
Can VERY slowly get off xanax  Can do 1/4 or non in the morning and 1/2 lunch and dinner x 2 weeks Then Non in the morning or 1/4 lunch , and 1/2 dinner x 2 weeks.  And continue to slowly decrease the medication every 2-4 weeks, if you have symptoms stay at the same dose for longer  Memory Compensation Strategies  1. Use "WARM" strategy.  W= write it down  A= associate it  R= repeat it  M= make a mental note  2.   You can keep a Social worker.  Use a 3-ring notebook with sections for the following: calendar, important names and phone numbers,  medications, doctors' names/phone numbers, lists/reminders, and a section to journal what you did  each day.   3.    Use a calendar to write appointments down.  4.    Write yourself a schedule for the day.  This can be placed on the calendar or in a separate section of the Memory Notebook.  Keeping a  regular schedule can help memory.  5.    Use medication organizer with sections for each day or morning/evening pills.  You may need help loading it  6.    Keep a basket, or pegboard by the door.  Place items that you need to take out with you in the basket or on the pegboard.  You may also want to  include a message board for reminders.  7.    Use sticky notes.  Place sticky notes with reminders in a place where the task is performed.  For example: " turn off the  stove" placed by the stove, "lock the door" placed on the door at eye level, " take your medications" on  the bathroom mirror or by the place where you normally take your medications.  8.    Use alarms/timers.  Use while cooking to remind yourself to check on food or as a reminder to take your medicine, or as a  reminder to make a call, or as a reminder to perform another task, etc.    Fatigue Fatigue is feeling tired all of the time, a lack of energy, or a lack of motivation. Occasional or mild fatigue is often a normal response to activity or life in general. However,  long-lasting (chronic) or extreme fatigue may indicate an underlying medical condition. Follow these instructions at home: Watch your fatigue for any changes. The following actions may help to lessen any discomfort you are feeling:  Talk to your health care provider about how much sleep you need each night. Try to get the required amount every night.  Take medicines only as directed by your health care provider.  Eat a healthy and nutritious diet. Ask your health care provider if you need help changing your diet.  Drink enough fluid to keep your urine clear or pale yellow.  Practice ways of relaxing, such as yoga, meditation, massage therapy, or acupuncture.  Exercise regularly.  Change situations that cause you stress. Try to keep your work and personal routine reasonable.  Do not abuse illegal drugs.  Limit alcohol intake to no more than 1 drink per day for nonpregnant women and 2 drinks per day for men. One drink equals 12 ounces of beer, 5 ounces of wine, or 1 ounces of hard liquor.  Take a multivitamin, if directed by your health care provider. Contact a health care provider if:  Your fatigue does not get better.  You have a fever.  You have unintentional weight loss or gain.  You have headaches.  You have difficulty:  Falling asleep.  Sleeping throughout the night.  You feel angry, guilty, anxious, or sad.  You are unable to have a bowel movement (constipation).  You skin is dry.  Your legs or another part of your body is swollen. Get help right away if:  You feel confused.  Your vision is blurry.  You feel faint or pass out.  You have a severe headache.  You have severe abdominal, pelvic, or back pain.  You have chest pain, shortness of breath, or an irregular or fast heartbeat.  You are unable to urinate or you urinate less than normal.  You develop abnormal bleeding, such as bleeding from the rectum, vagina, nose, lungs, or nipples.  You vomit  blood.  You have thoughts about harming yourself or committing suicide.  You are worried that you might harm someone else. This information is not intended to replace advice given to you by your health care provider. Make sure you discuss any questions you have with your health care provider. Document Released: 04/30/2007 Document Revised: 12/09/2015 Document Reviewed: 11/04/2013 Elsevier Interactive Patient Education  2017 [employer].

## 2016-11-06 NOTE — Progress Notes (Signed)
Subjective:    Patient ID: Kayla Ware, female    DOB: 03-11-1961, 56 y.o.   MRN: [id number]  HPI 56 y.o. WF with history of GAD, has been on xanax 3 x a day, 1/2 TID She has mood swings, will have anxiety with panic attacks while driving and wanting to leave work they are so bad, feels memory is worse the last 6 months. Husband is here and states that she has left the stove on, trouble ordering from drive thru, trouble with days of the week. She would like to get off the xanax due to this. Having dull headaches, feels "head humming", had normal B12, iron, [place] and normal MRI 2015. States she sleeps well most of the time, does not feel rested when she wakes up. Mild snoring and has woke herself up occ.   Medications Current Outpatient Prescriptions on File Prior to Visit  Medication Sig  . ALPRAZolam (XANAX) 1 MG tablet Take 1/2 to 1 Tablet 2 x / day if needed for anxiety   No current facility-administered medications on file prior to visit.     Problem list She has Anxiety; Hyperlipidemia; Vitamin D deficiency; Prediabetes; and Medication management on her problem list.   Review of Systems  Constitutional: Positive for fatigue.  HENT: Negative for trouble swallowing.   Genitourinary: Negative.   Musculoskeletal: Positive for arthralgias and myalgias. Negative for back pain, gait problem, joint swelling and neck stiffness.  Neurological: Positive for dizziness and headaches. Negative for tremors, seizures, syncope, facial asymmetry, speech difficulty, light-headedness and numbness.  Hematological: Negative.   Psychiatric/Behavioral: Positive for decreased concentration, dysphoric mood and sleep disturbance. Negative for hallucinations, self-injury and suicidal ideas. The patient is nervous/anxious.   All other systems reviewed and are negative.      Objective:   Physical Exam  Constitutional: She is oriented to person, place, and time. She appears  well-developed and well-nourished. No distress.  HENT:  Head: Normocephalic and atraumatic.  Right Ear: External ear normal.  Left Ear: External ear normal.  Nose: Nose normal.  Mouth/Throat: Oropharynx is clear and moist.  Eyes: Conjunctivae and EOM are normal.  Neck: Normal range of motion. Neck supple. No JVD present. No thyromegaly present.  Cardiovascular: Normal rate, regular rhythm, normal heart sounds and intact distal pulses.   Pulmonary/Chest: Effort normal and breath sounds normal.  Abdominal: Soft. Bowel sounds are normal. She exhibits no distension and no mass. There is no tenderness. There is no rebound and no guarding.  Musculoskeletal: Normal range of motion. She exhibits no edema or tenderness.  Lymphadenopathy:    She has no cervical adenopathy.  Neurological: She is alert and oriented to person, place, and time. No cranial nerve deficit. Coordination normal.  Skin: Skin is warm and dry. No rash noted. No erythema. No pallor.  Psychiatric: She has a normal mood and affect. Her behavior is normal. Judgment and thought content normal.  Nursing note and vitals reviewed.      Assessment & Plan:   Memory changes ? From depression/anxiety, patient very anxious, will refer to neuro No focal deficits, no obvious memory issues on exam, possible mild hyperreflexia versus patient driven  -     Cancel: Copper, serum -     Sedimentation rate -     ANA -     Anti-DNA antibody, double-stranded -     RPR -     Lyme Aby, Wstrn. Blt. IgG & IgM w/bands -  CK -     HIV antibody -     Ambulatory referral to Neurology  GAD (generalized anxiety disorder) Continue xanax for now, will try to taper off slowly, declines any other medication since "very sensitive" May benefit from psych referral.    Future Appointments Date Time Provider [place]  12/05/2016 9:30 AM Vicie Mutters, PA-C [facility] None  03/29/2017 8:45 AM Starlyn Skeans, PA-C [facility] None

## 2016-11-07 LAB — RPR

## 2016-11-07 LAB — ANTI-NUCLEAR AB-TITER (ANA TITER): ANA Titer 1: 1:160 {titer} — ABNORMAL HIGH

## 2016-11-07 LAB — SEDIMENTATION RATE: SED RATE: 3 mm/h (ref 0–30)

## 2016-11-07 LAB — CK: CK TOTAL: 54 U/L (ref 7–177)

## 2016-11-07 LAB — ANA: ANA: POSITIVE — AB

## 2016-11-07 LAB — ANTI-DNA ANTIBODY, DOUBLE-STRANDED

## 2016-11-07 LAB — HIV ANTIBODY (ROUTINE TESTING W REFLEX): HIV: NONREACTIVE

## 2016-11-07 NOTE — Progress Notes (Signed)
Pt aware of lab results & voiced understanding of those results.

## 2016-11-08 LAB — LYME ABY, WSTRN BLT IGG & IGM W/BANDS

## 2016-11-09 ENCOUNTER — Encounter: Payer: Self-pay | Admitting: Internal Medicine

## 2016-11-09 NOTE — Progress Notes (Signed)
Pt states she will need a referral to NEURO.  Pt aware of lab results & voiced understanding of those results.

## 2016-11-09 NOTE — Progress Notes (Signed)
Pt aware of lab results & voiced understanding of those results.

## 2016-11-09 NOTE — Progress Notes (Signed)
LVM for pt to return office call for LAB results.

## 2016-12-05 ENCOUNTER — Ambulatory Visit: Payer: Self-pay | Admitting: Physician Assistant

## 2016-12-08 ENCOUNTER — Other Ambulatory Visit: Payer: Self-pay | Admitting: Physician Assistant

## 2016-12-08 DIAGNOSIS — F419 Anxiety disorder, unspecified: Secondary | ICD-10-CM

## 2016-12-08 MED ORDER — ALPRAZOLAM 1 MG PO TABS: ORAL_TABLET | ORAL | 0 refills | Status: DC

## 2016-12-08 MED ORDER — ALPRAZOLAM 1 MG PO TABS: ORAL_TABLET | ORAL | 1 refills | Status: DC

## 2016-12-08 NOTE — Progress Notes (Unsigned)
Future Appointments Date Time Provider [place]  12/18/2016 7:30 AM Melvenia Beam, MD [facility] None  12/18/2016 3:45 PM Vicie Mutters, PA-C [facility] None  03/29/2017 8:45 AM Vicie Mutters, PA-C [facility] None

## 2016-12-12 NOTE — Progress Notes (Signed)
XANAX WAS CALLED INTO PHARMACY ON 29TH MAY 2018 @ 10:10AM BY DD

## 2016-12-18 ENCOUNTER — Encounter (INDEPENDENT_AMBULATORY_CARE_PROVIDER_SITE_OTHER): Payer: Self-pay

## 2016-12-18 ENCOUNTER — Ambulatory Visit (INDEPENDENT_AMBULATORY_CARE_PROVIDER_SITE_OTHER): Payer: BLUE CROSS/BLUE SHIELD | Admitting: Physician Assistant

## 2016-12-18 ENCOUNTER — Ambulatory Visit (INDEPENDENT_AMBULATORY_CARE_PROVIDER_SITE_OTHER): Payer: BLUE CROSS/BLUE SHIELD | Admitting: Neurology

## 2016-12-18 ENCOUNTER — Encounter: Payer: Self-pay | Admitting: Physician Assistant

## 2016-12-18 ENCOUNTER — Encounter: Payer: Self-pay | Admitting: Neurology

## 2016-12-18 VITALS — BP 110/69 | HR 56 | Resp 20 | Ht 62.0 in | Wt 110.0 lb

## 2016-12-18 VITALS — BP 116/62 | HR 62 | Temp 97.3°F | Resp 16 | Ht 62.0 in | Wt 110.0 lb

## 2016-12-18 DIAGNOSIS — R413 Other amnesia: Secondary | ICD-10-CM | POA: Diagnosis not present

## 2016-12-18 DIAGNOSIS — F439 Reaction to severe stress, unspecified: Secondary | ICD-10-CM | POA: Diagnosis not present

## 2016-12-18 DIAGNOSIS — F411 Generalized anxiety disorder: Secondary | ICD-10-CM

## 2016-12-18 DIAGNOSIS — R419 Unspecified symptoms and signs involving cognitive functions and awareness: Secondary | ICD-10-CM | POA: Diagnosis not present

## 2016-12-18 DIAGNOSIS — H93A9 Pulsatile tinnitus, unspecified ear: Secondary | ICD-10-CM | POA: Diagnosis not present

## 2016-12-18 MED ORDER — ESCITALOPRAM OXALATE 10 MG PO TABS: 10.0000 mg | ORAL_TABLET | Freq: Every day | ORAL | 2 refills | Status: DC

## 2016-12-18 NOTE — Progress Notes (Addendum)
[facility]    Provider:  Dr Jaynee Eagles Referring Provider: Unk Pinto, MD, Vicie Mutters PA-C Primary Care Physician:  Unk Pinto, MD  CC:  Memory concerns  HPI:  Kayla Ware is a 56 y.o. female here as a referral from Dr. Melford Aase for memory changes. She has a past medical history of generalized anxiety disorder on Xanax. Patient had an MRI in 2015 because she was complaining at the time of memory change, fatigue and malaise. CT of the head in 2007 was performed for nausea and vomiting and alcohol intoxication which was normal per report. Her memory is worsening, she has been on Xanax for 5 years, she has humming in he head. She has humming on the right side of the head, she can feel the blood flow on the right side of the head and sometimes the whole top of the head like it is being squeezed. She can hear a heart beat in her ear. No pain. Continous 24x7, ongoing for years unclear when it started. She has forgotten to turn off the stove. She can;t finish a sentence, she forgets what she is going to say. Her son and daughter are going through some hard times, one went through a terrible divorce, one is living on the street. The son calls 30-40x a day and had 4 different jobs last year. No family history of dementia. No other focal neurologic deficits, associated symptoms, inciting events or modifiable factors. No significant alcohol. Husband is her and provides much information. No significant snoring or witnessed apneic events.  Reviewed notes, labs and imaging from outside physicians, which showed: 4  CMP in January 2018 was unremarkable. TSH in April 2017 normal, vitamin D and April 2017 normal, vitamin B12 in 2017 April was normal as well.  Firstly reviewed images of MRI of the brain September 2015 which were normal.  Reviewed report CT of the head in 2007 which was unremarkable.  Reviewed notes from the emergency room 04/14/2017. She presented to the ED  with complaint of weakness. This is in the setting of Cymbalta first dose. She became weak and lightheaded like she was going to pass out. She also became nauseated. With numbness in her right foot. CT of the head at that time per report showed no acute intracranial abnormality. Normal sinus rhythm. Diagnosed with medication reaction. Symptoms resolved. Workup was negative.  Patient has a history of generalized anxiety disorder on Xanax. She has mood swings, anxiety and panic attacks while driving. Feels memory is worse last 6 months. Husband reports she is on the stove on, trouble ordering the drive through, trouble with these of the week. Having dull headaches with "head". Normal B12, iron, normal CT of the head at [facility] abnormal MRI in 2015. She sleeps well most of the time. Mild snoring and has woken herself up.    Review of Systems: Patient complains of symptoms per HPI as well as the following symptoms: Murmur, snoring, flushing, like a memory loss, anxiety, joint pain, joint swelling, aching muscles, blood in stools, constipation, ringing in ears. Pertinent negatives and positives per HPI. All others negative.   Social History   Social History  . Marital status: Married    Spouse name: N/A  . Number of children: N/A  . Years of education: N/A   Occupational History  . Not on file.   Social History Main Topics  . Smoking status: Never Smoker  . Smokeless tobacco: Never Used  . Alcohol use 3.6 oz/week  6 Cans of beer per week  . Drug use: No  . Sexual activity: Not on file   Other Topics Concern  . Not on file   Social History Narrative  . No narrative on file    Family History  Problem Relation Age of Onset  . Colon polyps Father   . Hyperlipidemia Father   . Depression Father   . Colon cancer Paternal Aunt 81  . Arthritis Brother        RHEUMATOID  . Cancer Maternal Aunt        breast, cervical  . Cancer Maternal Grandmother   . Birth defects Maternal  Grandmother        breast  . Diabetes Paternal Grandmother   . Dementia Neg Hx     Past Medical History:  Diagnosis Date  . Anxiety   . Depression   . Gall stones   . Heart murmur   . Hyperlipidemia   . Unspecified vitamin D deficiency     Past Surgical History:  Procedure Laterality Date  . bo surgical history    . none      Current Outpatient Prescriptions  Medication Sig Dispense Refill  . ALPRAZolam (XANAX) 1 MG tablet Take 1/2 to 1 Tablet 2 x / day if needed for anxiety 180 tablet 0   No current facility-administered medications for this visit.     Allergies as of 12/18/2016 - Review Complete 12/18/2016  Allergen Reaction Noted  . Citalopram Other (See Comments) 06/01/2013  . Erythromycin Nausea And Vomiting 11/15/2012  . Meloxicam Other (See Comments) 06/01/2013  . Propofol Other (See Comments) 06/01/2013  . Codeine Nausea And Vomiting 08/14/2016    Vitals: BP 110/69   Pulse (!) 56   Resp 20   Ht 5\' 2"  (1.575 m)   Wt 110 lb (49.9 kg)   BMI 20.12 kg/m  Last Weight:  Wt Readings from Last 1 Encounters:  12/18/16 110 lb (49.9 kg)   Last Height:   Ht Readings from Last 1 Encounters:  12/18/16 5\' 2"  (1.575 m)   Physical exam: Exam: Gen: NAD, conversant CV: RRR, no MRG. No Carotid Bruits. No peripheral edema, warm, nontender Eyes: Conjunctivae clear without exudates or hemorrhage  Neuro: Detailed Neurologic Exam  Speech:    Speech is normal; fluent and spontaneous with normal comprehension.  Cognition:  MMSE - Mini Mental State Exam 12/18/2016  Orientation to time 5  Orientation to Place 5  Registration 3  Attention/ Calculation 5  Attention/Calculation-comments refused numbers  Recall 2  Language- name 2 objects 2  Language- repeat 1  Language- follow 3 step command 3  Language- read & follow direction 1  Write a sentence 1  Copy design 0  Total score 28      The patient is oriented to person, place, and time;     recent and remote  memory intact;     language fluent;     normal attention, concentration,     fund of knowledge Cranial Nerves:    The pupils are equal, round, and reactive to light. The fundi are normal and spontaneous venous pulsations are present. Visual fields are full to finger confrontation. Extraocular movements are intact. Trigeminal sensation is intact and the muscles of mastication are normal. The face is symmetric. The palate elevates in the midline. Hearing intact. Voice is normal. Shoulder shrug is normal. The tongue has normal motion without fasciculations.   Coordination:    Normal finger to nose and heel to  shin. Normal rapid alternating movements.   Gait:    Heel-toe and tandem gait are normal.   Motor Observation:    No asymmetry, no atrophy, and no involuntary movements noted. Tone:    Normal muscle tone.    Posture:    Posture is normal. normal erect    Strength:    Strength is V/V in the upper and lower limbs.      Sensation: intact to LT     Reflex Exam:  DTR's:    Deep tendon reflexes in the upper and lower extremities are normal bilaterally.   Toes:    The toes are downgoing bilaterally.   Clonus:    Clonus is absent.       Assessment/Plan:  56 year old with anxiety and significant life stressors. She has memory concerns which are likely stress, anxiety, current life problems, medications that can cause memory changes (xanax) and normal cognitive aging. Will get an MRA head due to pulsatile tiniitus to ensure no aneurysms. Recommend psychiatry and counseling for her anxiety, likely depression and stress. Discussed ways to alleviate stress:  Discussed lifestyle changes:   Include physical activity in your daily routine. Try a daily walk or other moderate aerobic exercise.  Manage stress. Find healthy ways to cope with the stressors, such as delegating tasks on your to-do list.  Practice relaxation techniques. Try deep breathing, yoga, massage and visualization.  Eat  regularly. Eating regularly scheduled meals and maintaining a healthy diet might help prevent headaches. Also, drink plenty of fluids.  Follow a regular sleep schedule. Sleep deprivation might contribute to headaches Consider biofeedback. With this mind-body technique, you learn to control certain bodily functions - such as muscle tension, heart rate and blood pressure    Proceed to emergency room if you experience new or worsening symptoms or symptoms do not resolve, if you have new neurologic symptoms  or for any concerning symptom.   Cc: Unk Pinto, MD, Vicie Mutters PA-C  Sarina Ill, MD  [facility] [address] [place], [place] [address]  Phone [phone] Fax [phone]

## 2016-12-18 NOTE — Patient Instructions (Signed)
Remember to drink plenty of fluid, eat healthy meals and do not skip any meals. Try to eat protein with a every meal and eat a healthy snack such as fruit or nuts in between meals. Try to keep a regular sleep-wake schedule and try to exercise daily, particularly in the form of walking, 20-30 minutes a day, if you can.   As far as diagnostic testing: MRA of the head  I would like to see you back as needed, sooner if we need to. Please call us with any interim questions, concerns, problems, updates or refill requests.   Our phone number is [phone]. We also have an after hours call service for urgent matters and there is a physician on-call for urgent questions. For any emergencies you know to call 911 or go to the nearest emergency room   Stress and Stress Management Stress is a normal reaction to life events. It is what you feel when life demands more than you are used to or more than you can handle. Some stress can be useful. For example, the stress reaction can help you catch the last bus of the day, study for a test, or meet a deadline at work. But stress that occurs too often or for too long can cause problems. It can affect your emotional health and interfere with relationships and normal daily activities. Too much stress can weaken your immune system and increase your risk for physical illness. If you already have a medical problem, stress can make it worse. What are the causes? All sorts of life events may cause stress. An event that causes stress for one person may not be stressful for another person. Major life events commonly cause stress. These may be positive or negative. Examples include losing your job, moving into a new home, getting married, having a baby, or losing a loved one. Less obvious life events may also cause stress, especially if they occur day after day or in combination. Examples include working long hours, driving in traffic, caring for children, being in debt, or being  in a difficult relationship. What are the signs or symptoms? Stress may cause emotional symptoms including, the following:  Anxiety. This is feeling worried, afraid, on edge, overwhelmed, or out of control.  Anger. This is feeling irritated or impatient.  Depression. This is feeling sad, down, helpless, or guilty.  Difficulty focusing, remembering, or making decisions.  Stress may cause physical symptoms, including the following:  Aches and pains. These may affect your head, neck, back, stomach, or other areas of your body.  Tight muscles or clenched jaw.  Low energy or trouble sleeping.  Stress may cause unhealthy behaviors, including the following:  Eating to feel better (overeating) or skipping meals.  Sleeping too little, too much, or both.  Working too much or putting off tasks (procrastination).  Smoking, drinking alcohol, or using drugs to feel better.  How is this diagnosed? Stress is diagnosed through an assessment by your health care provider. Your health care provider will ask questions about your symptoms and any stressful life events.Your health care provider will also ask about your medical history and may order blood tests or other tests. Certain medical conditions and medicine can cause physical symptoms similar to stress. Mental illness can cause emotional symptoms and unhealthy behaviors similar to stress. Your health care provider may refer you to a mental health professional for further evaluation. How is this treated? Stress management is the recommended treatment for stress.The goals of stress management are  reducing stressful life events and coping with stress in healthy ways. Techniques for reducing stressful life events include the following:  Stress identification. Self-monitor for stress and identify what causes stress for you. These skills may help you to avoid some stressful events.  Time management. Set your priorities, keep a calendar of events,  and learn to say "no." These tools can help you avoid making too many commitments.  Techniques for coping with stress include the following:  Rethinking the problem. Try to think realistically about stressful events rather than ignoring them or overreacting. Try to find the positives in a stressful situation rather than focusing on the negatives.  Exercise. Physical exercise can release both physical and emotional tension. The key is to find a form of exercise you enjoy and do it regularly.  Relaxation techniques. These relax the body and mind. Examples include yoga, meditation, tai chi, biofeedback, deep breathing, progressive muscle relaxation, listening to music, being out in nature, journaling, and other hobbies. Again, the key is to find one or more that you enjoy and can do regularly.  Healthy lifestyle. Eat a balanced diet, get plenty of sleep, and do not smoke. Avoid using alcohol or drugs to relax.  Strong support network. Spend time with family, friends, or other people you enjoy being around.Express your feelings and talk things over with someone you trust.  Counseling or talktherapy with a mental health professional may be helpful if you are having difficulty managing stress on your own. Medicine is typically not recommended for the treatment of stress.Talk to your health care provider if you think you need medicine for symptoms of stress. Follow these instructions at home:  Keep all follow-up visits as directed by your health care provider.  Take all medicines as directed by your health care provider. Contact a health care provider if:  Your symptoms get worse or you start having new symptoms.  You feel overwhelmed by your problems and can no longer manage them on your own. Get help right away if:  You feel like hurting yourself or someone else. This information is not intended to replace advice given to you by your health care provider. Make sure you discuss any questions  you have with your health care provider. Document Released: 12/27/2000 Document Revised: 12/09/2015 Document Reviewed: 02/25/2013 Elsevier Interactive Patient Education  2017 [employer].

## 2016-12-18 NOTE — Progress Notes (Signed)
   Assessment and Plan::  Memory changes/ GAD (generalized anxiety disorder) Following with neuro, getting MRA Possible from anxiety/depression, will try lexapro if this fails will try trintellix versus psych referral.  -     escitalopram (LEXAPRO) 10 MG tablet; Take 1 tablet (10 mg total) by mouth daily.  The patient was advised to call immediately if she has any concerning symptoms in the interval. The patient voices understanding of current treatment options and is in agreement with the current care plan.The patient knows to call the clinic with any problems, questions or concerns or go to the ER if any further progression of symptoms.    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  She has a lot of stress right now, son is homeless, she is taking at least 0.5 mg morning and evening and occ another 0.5mg  mid day, states she feels sick when she tries to get off them. Has tried cymbalta, zoloft, effexor.   Past Medical History:  Diagnosis Date  . Anxiety   . Depression   . Gall stones   . Heart murmur   . Hyperlipidemia   . Unspecified vitamin D deficiency      Allergies  Allergen Reactions  . Citalopram Other (See Comments)    FATIGUE   . Erythromycin Nausea And Vomiting  . Meloxicam Other (See Comments)    GI UPSET  . Propofol Other (See Comments)    TINNITUS  . Codeine Nausea And Vomiting    Current Outpatient Prescriptions on File Prior to Visit  Medication Sig  . ALPRAZolam (XANAX) 1 MG tablet Take 1/2 to 1 Tablet 2 x / day if needed for anxiety   No current facility-administered medications on file prior to visit.     ROS: all negative except above.   Physical Exam: Filed Weights   12/18/16 1437  Weight: 110 lb (49.9 kg)   BP 116/62   Pulse 62   Temp 97.3 F (36.3 C)   Resp 16   Ht 5'  2" (1.575 m)   Wt 110 lb (49.9 kg)   SpO2 97%   BMI 20.12 kg/m  General Appearance: Well nourished, in no apparent distress. Eyes: PERRLA, EOMs, conjunctiva no swelling or erythema Sinuses: No Frontal/maxillary tenderness ENT/Mouth: Ext aud canals clear, TMs without erythema, bulging. No erythema, swelling, or exudate on post pharynx.  Tonsils not swollen or erythematous. Hearing normal.  Neck: Supple, thyroid normal.  Respiratory: Respiratory effort normal, BS equal bilaterally without rales, rhonchi, wheezing or stridor.  Cardio: RRR with no MRGs. Brisk peripheral pulses without edema.  Abdomen: Soft, + BS.  Non tender, no guarding, rebound, hernias, masses. Lymphatics: Non tender without lymphadenopathy.  Musculoskeletal: Full ROM, 5/5 strength, normal gait.  Skin: Warm, dry without rashes, lesions, ecchymosis.  Neuro: Cranial nerves intact. Normal muscle tone, no cerebellar symptoms. Sensation intact.  Psych: Awake and oriented X 3, normal affect, Insight and Judgment appropriate.     Vicie Mutters, PA-C 2:45 PM [facility]

## 2016-12-18 NOTE — Patient Instructions (Signed)
Start of the lexapro 1/2 pill a day, can go up to 1 pill after 1-2 weeks.  Do not alter how you are taking the xanax at this time,  After 1-2 months on the lexapro we can try to decrease the xanax slowly   Major Depressive Disorder, Adult Major depressive disorder (MDD) is a mental health condition. It may also be called clinical depression or unipolar depression. MDD usually causes feelings of sadness, hopelessness, or helplessness. MDD can also cause physical symptoms. It can interfere with work, school, relationships, and other everyday activities. MDD may be mild, moderate, or severe. It may occur once (single episode major depressive disorder) or it may occur multiple times (recurrent major depressive disorder). What are the causes? The exact cause of this condition is not known. MDD is most likely caused by a combination of things, which may include:  Genetic factors. These are traits that are passed along from parent to child.  Individual factors. Your personality, your behavior, and the way you handle your thoughts and feelings may contribute to MDD. This includes personality traits and behaviors learned from others.  Physical factors, such as: ? Differences in the part of your brain that controls emotion. This part of your brain may be different than it is in people who do not have MDD. ? Long-term (chronic) medical or psychiatric illnesses.  Social factors. Traumatic experiences or major life changes may play a role in the development of MDD.  What increases the risk? This condition is more likely to develop in women. The following factors may also make you more likely to develop MDD:  A family history of depression.  Troubled family relationships.  Abnormally low levels of certain brain chemicals.  Traumatic events in childhood, especially abuse or the loss of a parent.  Being under a lot of stress, or long-term stress, especially from upsetting life experiences or  losses.  A history of: ? Chronic physical illness. ? Other mental health disorders. ? Substance abuse.  Poor living conditions.  Experiencing social exclusion or discrimination on a regular basis.  What are the signs or symptoms? The main symptoms of MDD typically include:  Constant depressed or irritable mood.  Loss of interest in things and activities.  MDD symptoms may also include:  Sleeping or eating too much or too little.  Unexplained weight change.  Fatigue or low energy.  Feelings of worthlessness or guilt.  Difficulty thinking clearly or making decisions.  Thoughts of suicide or of harming others.  Physical agitation or weakness.  Isolation.  Severe cases of MDD may also occur with other symptoms, such as:  Delusions or hallucinations, in which you imagine things that are not real (psychotic depression).  Low-level depression that lasts at least a year (chronic depression or persistent depressive disorder).  Extreme sadness and hopelessness (melancholic depression).  Trouble speaking and moving (catatonic depression).  How is this diagnosed? This condition may be diagnosed based on:  Your symptoms.  Your medical history, including your mental health history. This may involve tests to evaluate your mental health. You may be asked questions about your lifestyle, including any drug and alcohol use, and how long you have had symptoms of MDD.  A physical exam.  Blood tests to rule out other conditions.  You must have a depressed mood and at least four other MDD symptoms most of the day, nearly every day in the same 2-week timeframe before your health care provider can confirm a diagnosis of MDD. How is this  treated? This condition is usually treated by mental health professionals, such as psychologists, psychiatrists, and clinical social workers. You may need more than one type of treatment. Treatment may include:  Psychotherapy. This is also called  talk therapy or counseling. Types of psychotherapy include: ? Cognitive behavioral therapy (CBT). This type of therapy teaches you to recognize unhealthy feelings, thoughts, and behaviors, and replace them with positive thoughts and actions. ? Interpersonal therapy (IPT). This helps you to improve the way you relate to and communicate with others. ? Family therapy. This treatment includes members of your family.  Medicine to treat anxiety and depression, or to help you control certain emotions and behaviors.  Lifestyle changes, such as: ? Limiting alcohol and drug use. ? Exercising regularly. ? Getting plenty of sleep. ? Making healthy eating choices. ? Spending more time outdoors.  Treatments involving stimulation of the brain can be used in situations with extremely severe symptoms, or when medicine or other therapies do not work over time. These treatments include electroconvulsive therapy, transcranial magnetic stimulation, and vagal nerve stimulation. Follow these instructions at home: Activity  Return to your normal activities as told by your health care provider.  Exercise regularly and spend time outdoors as told by your health care provider. General instructions  Take over-the-counter and prescription medicines only as told by your health care provider.  Do not drink alcohol. If you drink alcohol, limit your alcohol intake to no more than 1 drink a day for nonpregnant women and 2 drinks a day for men. One drink equals 12 oz of beer, 5 oz of wine, or 1 oz of hard liquor. Alcohol can affect any antidepressant medicines you are taking. Talk to your health care provider about your alcohol use.  Eat a healthy diet and get plenty of sleep.  Find activities that you enjoy doing, and make time to do them.  Consider joining a support group. Your health care provider may be able to recommend a support group.  Keep all follow-up visits as told by your health care provider. This is  important. Where to find more information: [employer] on Mental Illness  www.nami.org  [place] National Institute of Mental Health  https://carter.com/  National Suicide Prevention Lifeline  1-800-273-TALK 3093108176). This is free, 24-hour help.  Contact a health care provider if:  Your symptoms get worse.  You develop new symptoms. Get help right away if:  You self-harm.  You have serious thoughts about hurting yourself or others.  You see, hear, taste, smell, or feel things that are not present (hallucinate). This information is not intended to replace advice given to you by your health care provider. Make sure you discuss any questions you have with your health care provider. Document Released: 10/28/2012 Document Revised: 03/09/2016 Document Reviewed: 01/12/2016 Elsevier Interactive Patient Education  2017 [employer].

## 2017-01-14 ENCOUNTER — Ambulatory Visit
Admission: RE | Admit: 2017-01-14 | Discharge: 2017-01-14 | Disposition: A | Discharge status: Home / Self Care | Payer: BLUE CROSS/BLUE SHIELD | Source: Ambulatory Visit | Attending: Neurology | Admitting: Neurology

## 2017-01-14 DIAGNOSIS — H93A9 Pulsatile tinnitus, unspecified ear: Secondary | ICD-10-CM

## 2017-01-14 DIAGNOSIS — R413 Other amnesia: Secondary | ICD-10-CM | POA: Diagnosis not present

## 2017-01-24 ENCOUNTER — Telehealth: Payer: Self-pay | Admitting: Neurology

## 2017-01-24 ENCOUNTER — Telehealth: Payer: Self-pay

## 2017-01-24 NOTE — Telephone Encounter (Signed)
Patient called office in reference to receiving MRI results.  Please call

## 2017-01-24 NOTE — Telephone Encounter (Signed)
Spoke with Kayla Ware to let her know to call Dr. Jaynee Eagles for a better understanding of her CT results. Kayla Ware agreed & hung up

## 2017-01-24 NOTE — Telephone Encounter (Signed)
-----   Message from Vicie Mutters, [place] sent at 01/24/2017 10:13 AM EDT ----- Regarding: RE: result notes Had she talked with Dr. Jaynee Eagles? Called their office? Suggest starting there.  Estill Bamberg ----- Message ----- From: Elenor Quinones, CMA Sent: 01/23/2017   2:47 PM To: Vicie Mutters, PA-C Subject: result notes                                   Pt does not understand her CT results that were released to her MyChart acct by Dr. Jaynee Eagles & would like you to look at them & just explain them to her. Advise please.

## 2017-01-25 ENCOUNTER — Telehealth: Payer: Self-pay

## 2017-01-25 DIAGNOSIS — R413 Other amnesia: Secondary | ICD-10-CM

## 2017-01-25 DIAGNOSIS — H93A9 Pulsatile tinnitus, unspecified ear: Secondary | ICD-10-CM

## 2017-01-25 NOTE — Telephone Encounter (Signed)
-----   Message from Melvenia Beam, MD sent at 01/19/2017 12:33 PM EDT ----- Delsa Sale, MRA was not clear and I would like to follow this up with a CTA of the head if she is ok with it, sometimes this happens. Please ask if she is ok with that and I can order thanks

## 2017-01-25 NOTE — Telephone Encounter (Signed)
Called pt w/ MRA results. Asked that she call back w/ questions and/or if she'd like to proceed w/ CTA.

## 2017-01-25 NOTE — Addendum Note (Signed)
Addended by: Monte Fantasia on: 01/25/2017 03:23 PM   Modules accepted: Orders

## 2017-01-25 NOTE — Telephone Encounter (Signed)
Pt called back. Reviewed results and recommendations w/ her and she is agreeable to proceeding w/ CTA. Orders entered.

## 2017-01-25 NOTE — Telephone Encounter (Signed)
Please see result note 

## 2017-01-26 NOTE — Telephone Encounter (Signed)
Returned pt's call but no answer. Left VM mssg to call back, before noon, if needed today.

## 2017-01-26 NOTE — Telephone Encounter (Signed)
Pt would like to speak back with RN Anderson Malta re: conversation on 7-12, please call

## 2017-01-26 NOTE — Telephone Encounter (Signed)
Patient called office returning RN's call.  Please call °

## 2017-01-26 NOTE — Telephone Encounter (Signed)
Spoke to pt who reports that she was reading about CTA online last night. Says that she has had the procedure in the past and could not tolerate the dye. She had side effects of feeling sick and it made her heart race. Would like to know if there's something else that she could have done instead.

## 2017-01-27 NOTE — Telephone Encounter (Signed)
CTA without contrast is not an option. If she did not have an actual allergic reaction to the dye I still recommend going forward with it. Sounds like she did not have an allergic reaction more of an intolerance or some side effects. Discuss with her please thanks

## 2017-01-29 NOTE — Telephone Encounter (Signed)
Called pt back and let her know that Dr. Jaynee Eagles still recommends that she have the procedure w/ contrast in order to get the information needed from the test. Also that her symptoms seem to have been a side effect or intolerance rather than an allergy. Pt asks if she could be prescribed prednisone to take prior to getting contrast to help prevent possible reaction. She has Xanax on hand if needed for anxiety.

## 2017-01-30 ENCOUNTER — Ambulatory Visit: Payer: Self-pay | Admitting: Physician Assistant

## 2017-01-31 NOTE — Telephone Encounter (Signed)
Sure, we can do the following:  Prednisone 50 mg PO QID beginning the day before the scheduled MR examination and continuing up to the time of the examination. If she is ok with this we can call it in, let me know what she says thanks

## 2017-02-01 MED ORDER — PREDNISONE 50 MG PO TABS: ORAL_TABLET | ORAL | 0 refills | Status: DC

## 2017-02-01 NOTE — Telephone Encounter (Signed)
Rx e-scribed to pharmacy. Pt notified via TC and may call back w/ any questions.

## 2017-02-01 NOTE — Addendum Note (Signed)
Addended by: Monte Fantasia on: 02/01/2017 10:12 AM   Modules accepted: Orders

## 2017-02-12 ENCOUNTER — Ambulatory Visit
Admission: RE | Admit: 2017-02-12 | Discharge: 2017-02-12 | Disposition: A | Discharge status: Home / Self Care | Payer: BLUE CROSS/BLUE SHIELD | Source: Ambulatory Visit | Attending: Neurology | Admitting: Neurology

## 2017-02-12 DIAGNOSIS — R413 Other amnesia: Secondary | ICD-10-CM

## 2017-02-12 DIAGNOSIS — H93A9 Pulsatile tinnitus, unspecified ear: Secondary | ICD-10-CM

## 2017-02-12 MED ORDER — IOPAMIDOL (ISOVUE-370) INJECTION 76%
75.0000 mL | Freq: Once | INTRAVENOUS | Status: AC | PRN
  Administered 2017-02-12: 75 mL via INTRAVENOUS

## 2017-02-14 ENCOUNTER — Telehealth: Payer: Self-pay

## 2017-02-14 NOTE — Telephone Encounter (Signed)
Called pt to review normal CT angiogram results. However, no answer and mailbox is full and cannot leave mssg.

## 2017-02-14 NOTE — Telephone Encounter (Signed)
-----   Message from Melvenia Beam, MD sent at 02/12/2017  3:43 PM EDT ----- Exam is normal thanks

## 2017-02-15 ENCOUNTER — Ambulatory Visit: Payer: Self-pay | Admitting: Physician Assistant

## 2017-02-16 ENCOUNTER — Telehealth: Payer: Self-pay | Admitting: *Deleted

## 2017-02-16 NOTE — Telephone Encounter (Signed)
error 

## 2017-02-21 ENCOUNTER — Other Ambulatory Visit: Payer: Self-pay | Admitting: Physician Assistant

## 2017-02-21 ENCOUNTER — Encounter: Payer: Self-pay | Admitting: Neurology

## 2017-02-21 DIAGNOSIS — R350 Frequency of micturition: Secondary | ICD-10-CM

## 2017-02-22 ENCOUNTER — Ambulatory Visit (INDEPENDENT_AMBULATORY_CARE_PROVIDER_SITE_OTHER): Payer: BLUE CROSS/BLUE SHIELD

## 2017-02-22 ENCOUNTER — Other Ambulatory Visit: Payer: Self-pay

## 2017-02-22 ENCOUNTER — Encounter: Payer: Self-pay | Admitting: Neurology

## 2017-02-22 DIAGNOSIS — R350 Frequency of micturition: Secondary | ICD-10-CM | POA: Diagnosis not present

## 2017-02-22 DIAGNOSIS — H93A9 Pulsatile tinnitus, unspecified ear: Secondary | ICD-10-CM

## 2017-02-22 NOTE — Progress Notes (Signed)
Pt presents for U/A & U/C. Pt complains of urinary urgency feeling that comes & goes.

## 2017-02-22 NOTE — Addendum Note (Signed)
Addended by: Vicie Mutters R on: 02/22/2017 05:12 PM   Modules accepted: Orders

## 2017-02-23 LAB — URINALYSIS, ROUTINE W REFLEX MICROSCOPIC
BILIRUBIN URINE: NEGATIVE
GLUCOSE, UA: NEGATIVE
HGB URINE DIPSTICK: NEGATIVE
Ketones, ur: NEGATIVE
Leukocytes, UA: NEGATIVE
Nitrite: NEGATIVE
PROTEIN: NEGATIVE
Specific Gravity, Urine: 1.008 (ref 1.001–1.035)
pH: 6.5 (ref 5.0–8.0)

## 2017-02-23 NOTE — Addendum Note (Signed)
Addended by: Monte Fantasia on: 02/23/2017 10:49 AM   Modules accepted: Orders

## 2017-02-25 ENCOUNTER — Other Ambulatory Visit: Payer: Self-pay | Admitting: Physician Assistant

## 2017-02-25 DIAGNOSIS — R350 Frequency of micturition: Secondary | ICD-10-CM

## 2017-02-25 LAB — URINE CULTURE

## 2017-02-25 MED ORDER — SULFAMETHOXAZOLE-TRIMETHOPRIM 800-160 MG PO TABS: 1.0000 | ORAL_TABLET | Freq: Two times a day (BID) | ORAL | 0 refills | Status: DC

## 2017-03-02 ENCOUNTER — Telehealth: Payer: Self-pay | Admitting: Neurology

## 2017-03-02 NOTE — Telephone Encounter (Signed)
Patient wanted me to send her to another ENT . Telephone [phone] - fax [phone].

## 2017-03-12 NOTE — Progress Notes (Signed)
Patient ID: Kayla Ware, female   DOB: 10-Dec-1960, 56 y.o.   MRN: [id number]  Assessment and Plan:  Anxiety - try prozac for hot flashes/anxiety, start slow and low, will do 10mg  Follow up 3 months  Cholesterol: -Continue diet and exercise.  -Check cholesterol next OV  Pre-diabetes: -Continue diet and exercise.  -Check A1C next OV  Vitamin D Def: -check level -continue medications.   Continue diet and meds as discussed. Further disposition pending results of labs.  HPI 56 y.o. female  presents for 3 month follow up with hypertension, hyperlipidemia, prediabetes and vitamin D.   Her blood pressure has been controlled at home, today their BP is BP: 102/70.   She does workout. She denies chest pain, shortness of breath, dizziness.  She reports that she is still working 3 jobs and is gardening.   Patient has anxiety, had memory issues/anxiety, normal very through neuro workup with Dr. Jaynee Eagles. Will follow up with ENT.   She is on cholesterol medication and denies myalgias. Her cholesterol is at goal. The cholesterol last visit was:   Lab Results  Component Value Date   CHOL 169 11/10/2015   HDL 49 11/10/2015   LDLCALC 106 11/10/2015   TRIG 68 11/10/2015   CHOLHDL 3.4 11/10/2015     She has been working on diet and exercise for prediabetes, and denies foot ulcerations, hyperglycemia, hypoglycemia , increased appetite, nausea, paresthesia of the feet, polydipsia, polyuria, visual disturbances, vomiting and weight loss. Last A1C in the office was:  Lab Results  Component Value Date   HGBA1C 5.6 11/10/2015   Patient is on Vitamin D supplement.  Lab Results  Component Value Date   VD25OH 65 11/10/2015    She is taking 5,000 IU daily.    BMI is Body mass index is 19.57 kg/m., she is working on diet and exercise. Wt Readings from Last 3 Encounters:  03/13/17 107 lb (48.5 kg)  12/18/16 110 lb (49.9 kg)  12/18/16 110 lb (49.9 kg)      Current Medications:  Current  Outpatient Prescriptions on File Prior to Visit  Medication Sig Dispense Refill  . ALPRAZolam (XANAX) 1 MG tablet Take 1/2 to 1 Tablet 2 x / day if needed for anxiety 180 tablet 0   No current facility-administered medications on file prior to visit.     Medical History:  Past Medical History:  Diagnosis Date  . Anxiety   . Depression   . Gall stones   . Heart murmur   . Hyperlipidemia   . Unspecified vitamin D deficiency     Allergies:  Allergies  Allergen Reactions  . Citalopram Other (See Comments)    FATIGUE   . Erythromycin Nausea And Vomiting  . Meloxicam Other (See Comments)    GI UPSET  . Propofol Other (See Comments)    TINNITUS  . Codeine Nausea And Vomiting     Review of Systems:  Review of Systems  Constitutional: Negative for chills, fever and malaise/fatigue.  HENT: Negative for congestion, ear pain and sore throat.   Eyes: Negative for blurred vision and double vision.  Respiratory: Negative for cough, shortness of breath and wheezing.   Cardiovascular: Negative for chest pain, palpitations and leg swelling.  Gastrointestinal: Negative for blood in stool, constipation, diarrhea, heartburn and melena.  Genitourinary: Negative.   Skin: Negative.   Neurological: Negative for dizziness and loss of consciousness.  Psychiatric/Behavioral: Negative for depression. The patient is nervous/anxious. The patient does not have insomnia.  Family history- Review and unchanged  Social history- Review and unchanged  Physical Exam: BP 102/70   Pulse 60   Temp 97.7 F (36.5 C)   Resp 14   Ht 5\' 2"  (1.575 m)   Wt 107 lb (48.5 kg)   SpO2 96%   BMI 19.57 kg/m  Wt Readings from Last 3 Encounters:  03/13/17 107 lb (48.5 kg)  12/18/16 110 lb (49.9 kg)  12/18/16 110 lb (49.9 kg)    General Appearance: Well nourished well developed, in no apparent distress. Eyes: PERRLA, EOMs, conjunctiva no swelling or erythema ENT/Mouth: Ear canals normal without  obstruction, swelling, erythma, discharge.  TMs normal bilaterally.  Oropharynx moist, clear, without exudate, or postoropharyngeal swelling. Neck: Supple, thyroid normal,no cervical adenopathy  Respiratory: Respiratory effort normal, Breath sounds clear A&P without rhonchi, wheeze, or rale.  No retractions, no accessory usage. Cardio: RRR with no MRGs. Brisk peripheral pulses without edema.  Abdomen: Soft, + BS,  Non tender, no guarding, rebound, hernias, masses. Musculoskeletal: Full ROM, 5/5 strength, Normal gait Skin: Warm, dry without rashes, lesions, ecchymosis.  Neuro: Awake and oriented X 3, Cranial nerves intact. Normal muscle tone, no cerebellar symptoms. Psych: Normal affect, Insight and Judgment appropriate.    Vicie Mutters, PA-C 9:43 AM [facility]

## 2017-03-13 ENCOUNTER — Ambulatory Visit (INDEPENDENT_AMBULATORY_CARE_PROVIDER_SITE_OTHER): Payer: BLUE CROSS/BLUE SHIELD | Admitting: Physician Assistant

## 2017-03-13 ENCOUNTER — Encounter: Payer: Self-pay | Admitting: Physician Assistant

## 2017-03-13 VITALS — BP 102/70 | HR 60 | Temp 97.7°F | Resp 14 | Ht 62.0 in | Wt 107.0 lb

## 2017-03-13 DIAGNOSIS — R7303 Prediabetes: Secondary | ICD-10-CM | POA: Diagnosis not present

## 2017-03-13 DIAGNOSIS — F419 Anxiety disorder, unspecified: Secondary | ICD-10-CM

## 2017-03-13 DIAGNOSIS — E785 Hyperlipidemia, unspecified: Secondary | ICD-10-CM

## 2017-03-13 DIAGNOSIS — R350 Frequency of micturition: Secondary | ICD-10-CM | POA: Diagnosis not present

## 2017-03-13 DIAGNOSIS — Z79899 Other long term (current) drug therapy: Secondary | ICD-10-CM | POA: Diagnosis not present

## 2017-03-13 MED ORDER — FLUOXETINE HCL 10 MG PO CAPS: 10.0000 mg | ORAL_CAPSULE | Freq: Every day | ORAL | 2 refills | Status: DC

## 2017-03-13 NOTE — Patient Instructions (Signed)
Benefiber is good for constipation/diarrhea/irritable bowel syndrome, it helps with weight loss and can help lower your bad cholesterol. Please do 1 TBSP in the morning in water, coffee, or tea. It can take up to a month before you can see a difference with your bowel movements. It is cheapest from [employer], [employer], [employer].    Generalized Anxiety Disorder, Adult Generalized anxiety disorder (GAD) is a mental health disorder. People with this condition constantly worry about everyday events. Unlike normal anxiety, worry related to GAD is not triggered by a specific event. These worries also do not fade or get better with time. GAD interferes with life functions, including relationships, work, and school. GAD can vary from mild to severe. People with severe GAD can have intense waves of anxiety with physical symptoms (panic attacks). What are the causes? The exact cause of GAD is not known. What increases the risk? This condition is more likely to develop in:  Women.  People who have a family history of anxiety disorders.  People who are very shy.  People who experience very stressful life events, such as the death of a loved one.  People who have a very stressful family environment.  What are the signs or symptoms? People with GAD often worry excessively about many things in their lives, such as their health and family. They may also be overly concerned about:  Doing well at work.  Being on time.  Natural disasters.  Friendships.  Physical symptoms of GAD include:  Fatigue.  Muscle tension or having muscle twitches.  Trembling or feeling shaky.  Being easily startled.  Feeling like your heart is pounding or racing.  Feeling out of breath or like you cannot take a deep breath.  Having trouble falling asleep or staying asleep.  Sweating.  Nausea, diarrhea, or irritable bowel syndrome (IBS).  Headaches.  Trouble concentrating or remembering  facts.  Restlessness.  Irritability.  How is this diagnosed? Your health care provider can diagnose GAD based on your symptoms and medical history. You will also have a physical exam. The health care provider will ask specific questions about your symptoms, including how severe they are, when they started, and if they come and go. Your health care provider may ask you about your use of alcohol or drugs, including prescription medicines. Your health care provider may refer you to a mental health specialist for further evaluation. Your health care provider will do a thorough examination and may perform additional tests to rule out other possible causes of your symptoms. To be diagnosed with GAD, a person must have anxiety that:  Is out of his or her control.  Affects several different aspects of his or her life, such as work and relationships.  Causes distress that makes him or her unable to take part in normal activities.  Includes at least three physical symptoms of GAD, such as restlessness, fatigue, trouble concentrating, irritability, muscle tension, or sleep problems.  Before your health care provider can confirm a diagnosis of GAD, these symptoms must be present more days than they are not, and they must last for six months or longer. How is this treated? The following therapies are usually used to treat GAD:  Medicine. Antidepressant medicine is usually prescribed for long-term daily control. Antianxiety medicines may be added in severe cases, especially when panic attacks occur.  Talk therapy (psychotherapy). Certain types of talk therapy can be helpful in treating GAD by providing support, education, and guidance. Options include: ? Cognitive behavioral therapy (CBT).  People learn coping skills and techniques to ease their anxiety. They learn to identify unrealistic or negative thoughts and behaviors and to replace them with positive ones. ? Acceptance and commitment therapy (ACT).  This treatment teaches people how to be mindful as a way to cope with unwanted thoughts and feelings. ? Biofeedback. This process trains you to manage your body's response (physiological response) through breathing techniques and relaxation methods. You will work with a therapist while machines are used to monitor your physical symptoms.  Stress management techniques. These include yoga, meditation, and exercise.  A mental health specialist can help determine which treatment is best for you. Some people see improvement with one type of therapy. However, other people require a combination of therapies. Follow these instructions at home:  Take over-the-counter and prescription medicines only as told by your health care provider.  Try to maintain a normal routine.  Try to anticipate stressful situations and allow extra time to manage them.  Practice any stress management or self-calming techniques as taught by your health care provider.  Do not punish yourself for setbacks or for not making progress.  Try to recognize your accomplishments, even if they are small.  Keep all follow-up visits as told by your health care provider. This is important. Contact a health care provider if:  Your symptoms do not get better.  Your symptoms get worse.  You have signs of depression, such as: ? A persistently sad, cranky, or irritable mood. ? Loss of enjoyment in activities that used to bring you joy. ? Change in weight or eating. ? Changes in sleeping habits. ? Avoiding friends or family members. ? Loss of energy for normal tasks. ? Feelings of guilt or worthlessness. Get help right away if:  You have serious thoughts about hurting yourself or others. If you ever feel like you may hurt yourself or others, or have thoughts about taking your own life, get help right away. You can go to your nearest emergency department or call:  Your local emergency services (911 in the [place]  A suicide  crisis helpline, such as the [place] [place] at 747-465-3417. This is open 24 hours a day.  Summary  Generalized anxiety disorder (GAD) is a mental health disorder that involves worry that is not triggered by a specific event.  People with GAD often worry excessively about many things in their lives, such as their health and family.  GAD may cause physical symptoms such as restlessness, trouble concentrating, sleep problems, frequent sweating, nausea, diarrhea, headaches, and trembling or muscle twitching.  A mental health specialist can help determine which treatment is best for you. Some people see improvement with one type of therapy. However, other people require a combination of therapies. This information is not intended to replace advice given to you by your health care provider. Make sure you discuss any questions you have with your health care provider. Document Released: 10/28/2012 Document Revised: 05/23/2016 Document Reviewed: 05/23/2016 Elsevier Interactive Patient Education  [employer].

## 2017-03-15 LAB — URINALYSIS, ROUTINE W REFLEX MICROSCOPIC
BACTERIA UA: NONE SEEN /HPF
BILIRUBIN URINE: NEGATIVE
GLUCOSE, UA: NEGATIVE
HGB URINE DIPSTICK: NEGATIVE
Hyaline Cast: NONE SEEN /LPF
KETONES UR: NEGATIVE
NITRITE: NEGATIVE
PH: 5.5 (ref 5.0–8.0)
Protein, ur: NEGATIVE
RBC / HPF: NONE SEEN /HPF (ref 0–2)
SPECIFIC GRAVITY, URINE: 1.015 (ref 1.001–1.03)
WBC UA: NONE SEEN /HPF (ref 0–5)

## 2017-03-15 LAB — URINE CULTURE
MICRO NUMBER:: 80939953
SPECIMEN QUALITY:: ADEQUATE

## 2017-03-26 ENCOUNTER — Other Ambulatory Visit: Payer: Self-pay | Admitting: Physician Assistant

## 2017-03-26 DIAGNOSIS — F419 Anxiety disorder, unspecified: Secondary | ICD-10-CM

## 2017-03-26 MED ORDER — ALPRAZOLAM 1 MG PO TABS: ORAL_TABLET | ORAL | 0 refills | Status: DC

## 2017-03-26 NOTE — Progress Notes (Unsigned)
Future Appointments Date Time Provider [place]  06/21/2017 9:30 AM Vicie Mutters, PA-C [id number] None

## 2017-03-27 NOTE — Progress Notes (Signed)
[facility] SEPT 2018

## 2017-03-29 ENCOUNTER — Ambulatory Visit: Payer: Self-pay | Admitting: Internal Medicine

## 2017-03-29 ENCOUNTER — Ambulatory Visit: Payer: Self-pay | Admitting: Physician Assistant

## 2017-06-11 NOTE — Progress Notes (Signed)
Assessment and Plan:  Kayla Ware was seen today for abdominal pain.  Diagnoses and all orders for this visit:  Generalized abdominal pain Generalized unspecific pain ongoing for several months, + hx gallstones 2006~ -     CBC with Differential/Platelet -     BASIC METABOLIC PANEL WITH GFR -     Hepatic function panel -     Amylase -     Urinalysis, Routine w reflex microscopic -     US Abdomen Complete; Future Recommended patient obtain imodium and try tonight, Please go to the ER if you have any severe AB pain, unable to hold down food/water, blood in stool or vomit, chest pain, shortness of breath, or any worsening symptoms.   Further disposition pending results of labs. Discussed med's effects and SE's.   Over 30 minutes of exam, counseling, chart review, and critical decision making was performed.   Future Appointments  Date Time Provider [place]  06/21/2017  9:30 AM Vicie Mutters, PA-C [facility] None   ------------------------------------------------------------------------------------------------------------------   HPI BP 102/72   Pulse 77   Temp (!) 97.3 F (36.3 C)   Wt 107 lb 12.8 oz (48.9 kg)   SpO2 98%   BMI 19.72 kg/m   56 y.o.female presents for intermittent dull abdominal pain nearly every day -achy pain 3-4/10, that seems to move around - RUQ, epigastric, then lower abdominal/pelvic pressure- ongoing for 2 months on and off - she denies pain associated with food/particular time of day/activity. She denies N/V/D/constipation, melena, hematochezia. Denies other changes in stool character, or excessively fatty stools. Denies fever/chills, fatigue, dizziness, CP/SOB, rash. (she endorses ongoing HA that has been extensively worked up - reports she is unconcerned about this today).   She has been using miralax intermittently without notable effect.   Last imaging of abdomen/pelvis was CT with contrast in 2006 - unremarkable other than cholelithiasis. Last  colonoscopy 2014- benign polyps removed - due 2019.  Past Medical History:  Diagnosis Date  . Anxiety   . Depression   . Gall stones   . Heart murmur   . Hyperlipidemia   . Unspecified vitamin D deficiency      Allergies  Allergen Reactions  . Citalopram Other (See Comments)    FATIGUE   . Erythromycin Nausea And Vomiting  . Meloxicam Other (See Comments)    GI UPSET  . Propofol Other (See Comments)    TINNITUS  . Codeine Nausea And Vomiting    Current Outpatient Medications on File Prior to Visit  Medication Sig  . ALPRAZolam (XANAX) 1 MG tablet Take 1/2 to 1 Tablet 2 x / day if needed for anxiety  . FLUoxetine (PROZAC) 10 MG capsule Take 1 capsule (10 mg total) by mouth at bedtime. (Patient not taking: Reported on 06/12/2017)   No current facility-administered medications on file prior to visit.     ROS: Review of Systems  Constitutional: Negative for malaise/fatigue and weight loss.  HENT: Negative for hearing loss and tinnitus.   Eyes: Negative for blurred vision and double vision.  Respiratory: Negative for cough, shortness of breath and wheezing.   Cardiovascular: Negative for chest pain, palpitations, orthopnea, claudication and leg swelling.  Gastrointestinal: Negative for abdominal pain, blood in stool, constipation, diarrhea, heartburn, melena, nausea and vomiting.  Genitourinary: Negative.   Musculoskeletal: Negative for joint pain and myalgias.  Skin: Negative for rash.  Neurological: Negative for dizziness, tingling, sensory change, weakness and headaches.  Endo/Heme/Allergies: Negative for polydipsia.  Psychiatric/Behavioral: Negative.   All  other systems reviewed and are negative.    Physical Exam:  BP 102/72   Pulse 77   Temp (!) 97.3 F (36.3 C)   Wt 107 lb 12.8 oz (48.9 kg)   SpO2 98%   BMI 19.72 kg/m   General Appearance: Well nourished, in no apparent distress. Neck: Supple.  Respiratory: Respiratory effort normal, BS equal  bilaterally without rales, rhonchi, wheezing or stridor.  Cardio: RRR with no MRGs. Brisk peripheral pulses without edema.  Abdomen: Soft, + BS.  Nondistended; generalized tenderness worse in RUQ and epigastric areas, - Murphy's, does guard with epigastric palpation, no rebound, palpable hernias, masses. Lymphatics: Non tender without lymphadenopathy.  Musculoskeletal: Full ROM, 5/5 strength, normal gait.  Skin: Warm, dry without rashes, lesions, ecchymosis.  Neuro: Cranial nerves intact. Normal muscle tone, no cerebellar symptoms. Sensation intact.  Psych: Awake and oriented X 3, normal affect, Insight and Judgment appropriate.     Izora Ribas, NP 3:59 PM [facility]

## 2017-06-12 ENCOUNTER — Encounter: Payer: Self-pay | Admitting: Adult Health

## 2017-06-12 ENCOUNTER — Ambulatory Visit: Payer: BLUE CROSS/BLUE SHIELD | Admitting: Adult Health

## 2017-06-12 VITALS — BP 102/72 | HR 77 | Temp 97.3°F | Wt 107.8 lb

## 2017-06-12 DIAGNOSIS — R1084 Generalized abdominal pain: Secondary | ICD-10-CM | POA: Diagnosis not present

## 2017-06-13 ENCOUNTER — Encounter: Payer: Self-pay | Admitting: Adult Health

## 2017-06-13 LAB — URINALYSIS, ROUTINE W REFLEX MICROSCOPIC
Bilirubin Urine: NEGATIVE
GLUCOSE, UA: NEGATIVE
HGB URINE DIPSTICK: NEGATIVE
Ketones, ur: NEGATIVE
LEUKOCYTES UA: NEGATIVE
NITRITE: NEGATIVE
PH: 6 (ref 5.0–8.0)
PROTEIN: NEGATIVE
Specific Gravity, Urine: 1.02 (ref 1.001–1.03)

## 2017-06-13 LAB — CBC WITH DIFFERENTIAL/PLATELET
BASOS PCT: 0.7 %
Basophils Absolute: 43 cells/uL (ref 0–200)
EOS ABS: 68 {cells}/uL (ref 15–500)
Eosinophils Relative: 1.1 %
HEMATOCRIT: 41.6 % (ref 35.0–45.0)
HEMOGLOBIN: 14 g/dL (ref 11.7–15.5)
LYMPHS ABS: 2170 {cells}/uL (ref 850–3900)
MCH: 30.1 pg (ref 27.0–33.0)
MCHC: 33.7 g/dL (ref 32.0–36.0)
MCV: 89.5 fL (ref 80.0–100.0)
MPV: 9.9 fL (ref 7.5–12.5)
Monocytes Relative: 9.4 %
Neutro Abs: 3336 cells/uL (ref 1500–7800)
Neutrophils Relative %: 53.8 %
PLATELETS: 223 10*3/uL (ref 140–400)
RBC: 4.65 10*6/uL (ref 3.80–5.10)
RDW: 12.8 % (ref 11.0–15.0)
TOTAL LYMPHOCYTE: 35 %
WBC: 6.2 10*3/uL (ref 3.8–10.8)
WBCMIX: 583 {cells}/uL (ref 200–950)

## 2017-06-13 LAB — LIPASE: Lipase: 35 U/L (ref 7–60)

## 2017-06-13 LAB — HEPATIC FUNCTION PANEL
AG Ratio: 1.8 (calc) (ref 1.0–2.5)
ALBUMIN MSPROF: 4.5 g/dL (ref 3.6–5.1)
ALT: 9 U/L (ref 6–29)
AST: 19 U/L (ref 10–35)
Alkaline phosphatase (APISO): 94 U/L (ref 33–130)
BILIRUBIN DIRECT: 0.1 mg/dL (ref 0.0–0.2)
BILIRUBIN TOTAL: 0.5 mg/dL (ref 0.2–1.2)
GLOBULIN: 2.5 g/dL (ref 1.9–3.7)
Indirect Bilirubin: 0.4 mg/dL (calc) (ref 0.2–1.2)
Total Protein: 7 g/dL (ref 6.1–8.1)

## 2017-06-13 LAB — BASIC METABOLIC PANEL WITH GFR
BUN: 10 mg/dL (ref 7–25)
CALCIUM: 9.7 mg/dL (ref 8.6–10.4)
CO2: 30 mmol/L (ref 20–32)
CREATININE: 0.72 mg/dL (ref 0.50–1.05)
Chloride: 104 mmol/L (ref 98–110)
GFR, EST AFRICAN AMERICAN: 108 mL/min/{1.73_m2} (ref >=60)
GFR, EST NON AFRICAN AMERICAN: 93 mL/min/{1.73_m2} (ref >=60)
Glucose, Bld: 89 mg/dL (ref 65–99)
Potassium: 4.1 mmol/L (ref 3.5–5.3)
Sodium: 142 mmol/L (ref 135–146)

## 2017-06-13 LAB — AMYLASE: Amylase: 37 U/L (ref 21–101)

## 2017-06-21 ENCOUNTER — Ambulatory Visit: Payer: BLUE CROSS/BLUE SHIELD | Admitting: Physician Assistant

## 2017-06-21 ENCOUNTER — Encounter: Payer: Self-pay | Admitting: Physician Assistant

## 2017-06-21 VITALS — BP 102/66 | Temp 97.3°F | Resp 16 | Ht 62.0 in | Wt 110.4 lb

## 2017-06-21 DIAGNOSIS — Z79899 Other long term (current) drug therapy: Secondary | ICD-10-CM

## 2017-06-21 DIAGNOSIS — M545 Low back pain, unspecified: Secondary | ICD-10-CM

## 2017-06-21 DIAGNOSIS — F419 Anxiety disorder, unspecified: Secondary | ICD-10-CM

## 2017-06-21 DIAGNOSIS — E559 Vitamin D deficiency, unspecified: Secondary | ICD-10-CM

## 2017-06-21 DIAGNOSIS — E785 Hyperlipidemia, unspecified: Secondary | ICD-10-CM | POA: Diagnosis not present

## 2017-06-21 DIAGNOSIS — R7303 Prediabetes: Secondary | ICD-10-CM

## 2017-06-21 MED ORDER — VENLAFAXINE HCL ER 37.5 MG PO CP24: 37.5000 mg | ORAL_CAPSULE | Freq: Every day | ORAL | 2 refills | Status: DC

## 2017-06-21 MED ORDER — MELOXICAM 15 MG PO TABS
ORAL_TABLET | ORAL | 1 refills | Status: DC
Start: 2017-06-21 — End: 2017-10-30

## 2017-06-21 NOTE — Progress Notes (Signed)
Patient ID: Kayla Ware, female   DOB: Dec 09, 1960, 56 y.o.   MRN: [id number]  Assessment and Plan:  Anxiety Follow up at [facility] and message how she is doing Effexor 37.5mg  to try  Cholesterol: -Continue diet and exercise.  -Check cholesterol next OV  Pre-diabetes: -Continue diet and exercise.  -Check A1C next OV  Vitamin D Def: -check level -continue medications.   Back pain Try mobic If not better will refer to rotho No red flag symptoms  Continue diet and meds as discussed. Further disposition pending results of labs. Future Appointments  Date Time Provider [place]  10/30/2017 11:00 AM Vicie Mutters, PA-C [facility] None    HPI 56 y.o. female  presents for 3 month follow up with hypertension, hyperlipidemia, prediabetes and vitamin D.   Her blood pressure has been controlled at home, today their BP is BP: 102/66.   She does workout. She denies chest pain, shortness of breath, dizziness.  She reports that she is still working 3 jobs and is gardening.   Patient has anxiety, had memory issues/anxiety, normal neuro workup with Dr. Jaynee Eagles. Could not tolerate prozac.  She has right hip pain, lower back pain in the middle of her butt, hurts when she sits on hard chair, stands for long time. No pain down her legs, no numbness, tingling in her legs.   She is on cholesterol medication and denies myalgias. Her cholesterol is at goal. The cholesterol last visit was:   Lab Results  Component Value Date   CHOL 169 11/10/2015   HDL 49 11/10/2015   LDLCALC 106 11/10/2015   TRIG 68 11/10/2015   CHOLHDL 3.4 11/10/2015    She has been working on diet and exercise for prediabetes, and denies foot ulcerations, hyperglycemia, hypoglycemia , increased appetite, nausea, paresthesia of the feet, polydipsia, polyuria, visual disturbances, vomiting and weight loss. Last A1C in the office was:  Lab Results  Component Value Date   HGBA1C 5.6 11/10/2015   Patient is NOT on Vitamin D  supplement, getting DEXA Tuesday.  Lab Results  Component Value Date   VD25OH 59 11/10/2015   BMI is Body mass index is 20.19 kg/m., she is working on diet and exercise. Wt Readings from Last 3 Encounters:  06/21/17 110 lb 6.4 oz (50.1 kg)  06/12/17 107 lb 12.8 oz (48.9 kg)  03/13/17 107 lb (48.5 kg)      Current Medications:  Current Outpatient Medications on File Prior to Visit  Medication Sig Dispense Refill  . ALPRAZolam (XANAX) 1 MG tablet Take 1/2 to 1 Tablet 2 x / day if needed for anxiety 180 tablet 0   No current facility-administered medications on file prior to visit.     Medical History:  Past Medical History:  Diagnosis Date  . Anxiety   . Depression   . Gall stones   . Heart murmur   . Hyperlipidemia   . Unspecified vitamin D deficiency     Allergies:  Allergies  Allergen Reactions  . Citalopram Other (See Comments)    FATIGUE   . Erythromycin Nausea And Vomiting  . Meloxicam Other (See Comments)    GI UPSET  . Propofol Other (See Comments)    TINNITUS  . Codeine Nausea And Vomiting     Review of Systems:  Review of Systems  Constitutional: Negative for chills, fever and malaise/fatigue.  HENT: Negative for congestion, ear pain and sore throat.   Eyes: Negative for blurred vision and double vision.  Respiratory: Negative for  cough, shortness of breath and wheezing.   Cardiovascular: Negative for chest pain, palpitations and leg swelling.  Gastrointestinal: Negative for blood in stool, constipation, diarrhea, heartburn and melena.  Genitourinary: Negative.   Musculoskeletal: Positive for back pain.  Skin: Negative.   Neurological: Negative for dizziness and loss of consciousness.  Psychiatric/Behavioral: Negative for depression. The patient is nervous/anxious. The patient does not have insomnia.     Family history- Review and unchanged  Social history- Review and unchanged  Physical Exam: BP 102/66   Temp (!) 97.3 F (36.3 C)   Resp  16   Ht 5\' 2"  (1.575 m)   Wt 110 lb 6.4 oz (50.1 kg)   BMI 20.19 kg/m  Wt Readings from Last 3 Encounters:  06/21/17 110 lb 6.4 oz (50.1 kg)  06/12/17 107 lb 12.8 oz (48.9 kg)  03/13/17 107 lb (48.5 kg)    General Appearance: Well nourished well developed, in no apparent distress. Eyes: PERRLA, EOMs, conjunctiva no swelling or erythema ENT/Mouth: Ear canals normal without obstruction, swelling, erythma, discharge.  TMs normal bilaterally.  Oropharynx moist, clear, without exudate, or postoropharyngeal swelling. Neck: Supple, thyroid normal,no cervical adenopathy  Respiratory: Respiratory effort normal, Breath sounds clear A&P without rhonchi, wheeze, or rale.  No retractions, no accessory usage. Cardio: RRR with no MRGs. Brisk peripheral pulses without edema.  Abdomen: Soft, + BS,  Non tender, no guarding, rebound, hernias, masses. Musculoskeletal: Full ROM, 5/5 strength, Normal gait Skin: Warm, dry without rashes, lesions, ecchymosis.  Neuro: Awake and oriented X 3, Cranial nerves intact. Normal muscle tone, no cerebellar symptoms. Psych: Normal affect, Insight and Judgment appropriate.    Vicie Mutters, PA-C 10:03 AM [facility]

## 2017-06-21 NOTE — Patient Instructions (Signed)
Mobic is an antiinflammatory It helps pain, can not take with aleve, or ibuprofen You can take tylenol (500mg ) or tylenol arthritis (650mg ) with the meloxicam/antiinflammatories. The max you can take of tylenol a day is 3000mg  daily, this is a max of 6 pills a day of the regular tyelnol (500mg ) or a max of 4 a day of the tylenol arthritis (650mg ) as long as no other medications you are taking contain tylenol.   Mobic can cause inflammation in your stomach and can cause ulcers or bleeding, this will look like black tarry stools Make sure you take your mobic with food Try not to take it daily, take AS needed Can take with zantac  Try the effexor 37.5mg  daily, helps with hot flashes and anxiety, see how you are doing    Try the exercises and other information in the back care manual, meloxicam once during the day as needed (avoid taking other NSAIDS like Alleve or Ibuprofen while taking this) .  Go to the ER if you have any new weakness in your legs, have trouble controlling your urine or bowels, or have worsening pain.   If you are not better in 1-3 month we will refer you to ortho   Back pain Rehab Ask your health care provider which exercises are safe for you. Do exercises exactly as told by your health care provider and adjust them as directed. It is normal to feel mild stretching, pulling, tightness, or discomfort as you do these exercises, but you should stop right away if you feel sudden pain or your pain gets worse.Do not begin these exercises until told by your health care provider. Stretching and range of motion exercises These exercises warm up your muscles and joints and improve the movement and flexibility of your hips and your back. These exercises also help to relieve pain, numbness, and tingling. Exercise A: Sciatic nerve glide 1. Sit in a chair with your head facing down toward your chest. Place your hands behind your back. Let your shoulders slump forward. 2. Slowly  straighten one of your knees while you tilt your head back as if you are looking toward the ceiling. Only straighten your leg as far as you can without making your symptoms worse. 3. Hold for __________ seconds. 4. Slowly return to the starting position. 5. Repeat with your other leg. Repeat __________ times. Complete this exercise __________ times a day. Exercise B: Knee to chest with hip adduction and internal rotation  1. Lie on your back on a firm surface with both legs straight. 2. Bend one of your knees and move it up toward your chest until you feel a gentle stretch in your lower back and buttock. Then, move your knee toward the shoulder that is on the opposite side from your leg. ? Hold your leg in this position by holding onto the front of your knee. 3. Hold for __________ seconds. 4. Slowly return to the starting position. 5. Repeat with your other leg. Repeat __________ times. Complete this exercise __________ times a day. Exercise C: Prone extension on elbows  1. Lie on your abdomen on a firm surface. A bed may be too soft for this exercise. 2. Prop yourself up on your elbows. 3. Use your arms to help lift your chest up until you feel a gentle stretch in your abdomen and your lower back. ? This will place some of your body weight on your elbows. If this is uncomfortable, try stacking pillows under your chest. ? Your  hips should stay down, against the surface that you are lying on. Keep your hip and back muscles relaxed. 4. Hold for __________ seconds. 5. Slowly relax your upper body and return to the starting position. Repeat __________ times. Complete this exercise __________ times a day. Strengthening exercises These exercises build strength and endurance in your back. Endurance is the ability to use your muscles for a long time, even after they get tired. Exercise D: Pelvic tilt 1. Lie on your back on a firm surface. Bend your knees and keep your feet flat. 2. Tense your  abdominal muscles. Tip your pelvis up toward the ceiling and flatten your lower back into the floor. ? To help with this exercise, you may place a small towel under your lower back and try to push your back into the towel. 3. Hold for __________ seconds. 4. Let your muscles relax completely before you repeat this exercise. Repeat __________ times. Complete this exercise __________ times a day. Exercise E: Alternating arm and leg raises  1. Get on your hands and knees on a firm surface. If you are on a hard floor, you may want to use padding to cushion your knees, such as an exercise mat. 2. Line up your arms and legs. Your hands should be below your shoulders, and your knees should be below your hips. 3. Lift your left leg behind you. At the same time, raise your right arm and straighten it in front of you. ? Do not lift your leg higher than your hip. ? Do not lift your arm higher than your shoulder. ? Keep your abdominal and back muscles tight. ? Keep your hips facing the ground. ? Do not arch your back. ? Keep your balance carefully, and do not hold your breath. 4. Hold for __________ seconds. 5. Slowly return to the starting position and repeat with your right leg and your left arm. Repeat __________ times. Complete this exercise __________ times a day. Posture and body mechanics  Body mechanics refers to the movements and positions of your body while you do your daily activities. Posture is part of body mechanics. Good posture and healthy body mechanics can help to relieve stress in your body's tissues and joints. Good posture means that your spine is in its natural S-curve position (your spine is neutral), your shoulders are pulled back slightly, and your head is not tipped forward. The following are general guidelines for applying improved posture and body mechanics to your everyday activities. Standing   When standing, keep your spine neutral and your feet about hip-width apart. Keep  a slight bend in your knees. Your ears, shoulders, and hips should line up.  When you do a task in which you stand in one place for a long time, place one foot up on a stable object that is 2-4 inches (5-10 cm) high, such as a footstool. This helps keep your spine neutral. Sitting   When sitting, keep your spine neutral and keep your feet flat on the floor. Use a footrest, if necessary, and keep your thighs parallel to the floor. Avoid rounding your shoulders, and avoid tilting your head forward.  When working at a desk or a computer, keep your desk at a height where your hands are slightly lower than your elbows. Slide your chair under your desk so you are close enough to maintain good posture.  When working at a computer, place your monitor at a height where you are looking straight ahead and you do not  have to tilt your head forward or downward to look at the screen. Resting   When lying down and resting, avoid positions that are most painful for you.  If you have pain with activities such as sitting, bending, stooping, or squatting (flexion-based activities), lie in a position in which your body does not bend very much. For example, avoid curling up on your side with your arms and knees near your chest (fetal position).  If you have pain with activities such as standing for a long time or reaching with your arms (extension-based activities), lie with your spine in a neutral position and bend your knees slightly. Try the following positions: ? Lying on your side with a pillow between your knees. ? Lying on your back with a pillow under your knees. Lifting   When lifting objects, keep your feet at least shoulder-width apart and tighten your abdominal muscles.  Bend your knees and hips and keep your spine neutral. It is important to lift using the strength of your legs, not your back. Do not lock your knees straight out.  Always ask for help to lift heavy or awkward objects. This  information is not intended to replace advice given to you by your health care provider. Make sure you discuss any questions you have with your health care provider. Document Released: 07/03/2005 Document Revised: 03/09/2016 Document Reviewed: 03/19/2015 Elsevier Interactive Patient Education  [employer].

## 2017-07-23 ENCOUNTER — Other Ambulatory Visit: Payer: Self-pay | Admitting: Physician Assistant

## 2017-07-23 DIAGNOSIS — F419 Anxiety disorder, unspecified: Secondary | ICD-10-CM

## 2017-08-20 ENCOUNTER — Ambulatory Visit
Admission: RE | Admit: 2017-08-20 | Discharge: 2017-08-20 | Disposition: A | Discharge status: Home / Self Care | Payer: BLUE CROSS/BLUE SHIELD | Source: Ambulatory Visit | Attending: Adult Health | Admitting: Adult Health

## 2017-08-20 DIAGNOSIS — R1084 Generalized abdominal pain: Secondary | ICD-10-CM

## 2017-08-21 ENCOUNTER — Other Ambulatory Visit: Payer: Self-pay | Admitting: Adult Health

## 2017-08-21 ENCOUNTER — Encounter: Payer: Self-pay | Admitting: Adult Health

## 2017-08-21 DIAGNOSIS — K7689 Other specified diseases of liver: Secondary | ICD-10-CM | POA: Insufficient documentation

## 2017-08-31 ENCOUNTER — Telehealth: Payer: Self-pay | Admitting: Physician Assistant

## 2017-08-31 MED ORDER — PROMETHAZINE-DM 6.25-15 MG/5ML PO SYRP: 5.0000 mL | ORAL_SOLUTION | Freq: Four times a day (QID) | ORAL | 1 refills | Status: DC | PRN

## 2017-08-31 MED ORDER — PREDNISONE 20 MG PO TABS: ORAL_TABLET | ORAL | 0 refills | Status: DC

## 2017-08-31 MED ORDER — AMOXICILLIN-POT CLAVULANATE 875-125 MG PO TABS: 1.0000 | ORAL_TABLET | Freq: Two times a day (BID) | ORAL | 0 refills | Status: DC

## 2017-08-31 NOTE — Telephone Encounter (Signed)
57 y.o. female calls with 9 days of URI symptoms. She is on antihistamines, theraful and advil.   Symptoms include fever, chills, sore throat, sinus drainage, sinus pressure, cough with brown mucus.   Problem list has Anxiety; Hyperlipidemia; Vitamin D deficiency; Prediabetes; Medication management; Stress; Cognitive complaints; and Hepatic cyst on their problem list.  Medications Current Outpatient Medications on File Prior to Visit  Medication Sig  . ALPRAZolam (XANAX) 1 MG tablet TAKE 1/2 TO 1 (ONE-HALF TO ONE) TABLET BY MOUTH TWICE DAILY AS NEEDED FOR ANXIETY  . meloxicam (MOBIC) 15 MG tablet Take one daily with food for 2 weeks, can take with tylenol, can not take with aleve, iburpofen, then as needed daily for pain  . venlafaxine XR (EFFEXOR XR) 37.5 MG 24 hr capsule Take 1 capsule (37.5 mg total) by mouth daily.   No current facility-administered medications on file prior to visit.     Allergies  Allergen Reactions  . Citalopram Other (See Comments)    FATIGUE   . Erythromycin Nausea And Vomiting  . Meloxicam Other (See Comments)    GI UPSET  . Propofol Other (See Comments)    TINNITUS  . Codeine Nausea And Vomiting     I have prescribed Augmentin 875mg /125mg  one tablet twice daily with food, for 7 days.. You may use an oral decongestant such as Mucinex D or if you have glaucoma or high blood pressure use plain Mucinex.  Saline nasal sprays help and can safely be used as often as needed for congestion. Make sure you are on an allergy pill. Will send in prednisone, cough syrup.   If you develop worsening sinus pain, fever or notice severe headache and vision changes, or if symptoms are not better after completion of antibiotic, please schedule an appointment with a health care provider. If you are getting worse please go to the ER.

## 2017-09-03 NOTE — Telephone Encounter (Signed)
LMTCB

## 2017-10-08 ENCOUNTER — Ambulatory Visit (INDEPENDENT_AMBULATORY_CARE_PROVIDER_SITE_OTHER): Payer: BLUE CROSS/BLUE SHIELD | Admitting: Physician Assistant

## 2017-10-08 ENCOUNTER — Encounter: Payer: Self-pay | Admitting: Physician Assistant

## 2017-10-08 VITALS — BP 120/74 | HR 84 | Temp 97.6°F | Resp 14 | Ht 62.0 in | Wt 108.6 lb

## 2017-10-08 DIAGNOSIS — R1011 Right upper quadrant pain: Secondary | ICD-10-CM

## 2017-10-08 DIAGNOSIS — R112 Nausea with vomiting, unspecified: Secondary | ICD-10-CM

## 2017-10-08 DIAGNOSIS — K7689 Other specified diseases of liver: Secondary | ICD-10-CM | POA: Diagnosis not present

## 2017-10-08 DIAGNOSIS — K8020 Calculus of gallbladder without cholecystitis without obstruction: Secondary | ICD-10-CM

## 2017-10-08 DIAGNOSIS — R350 Frequency of micturition: Secondary | ICD-10-CM

## 2017-10-08 DIAGNOSIS — R1084 Generalized abdominal pain: Secondary | ICD-10-CM

## 2017-10-08 DIAGNOSIS — F419 Anxiety disorder, unspecified: Secondary | ICD-10-CM

## 2017-10-08 MED ORDER — OMEPRAZOLE 40 MG PO CPDR: 40.0000 mg | DELAYED_RELEASE_CAPSULE | Freq: Every day | ORAL | 0 refills | Status: DC

## 2017-10-08 NOTE — Progress Notes (Signed)
Subjective:    Patient ID: Kayla Ware, female    DOB: 1961-03-08, 57 y.o.   MRN: [id number]  HPI 57 y.o. WF presents with stopped up ears and AB pain.  She had virus 02/15, went to [facility], they stated her ear wax bilateral ears but it was not cleaned. She states she continues to have ear fullness with some pain. No fever, chills. Tried peroxide at home 2 x without help. She has sinus issues, on allergy pill that is helping.   She has history of AB pain, every day, for close to a year.   She states suprapubic AB is a dull ache/pressure, intermittent, no radiation, follows with Dr. Tressia Danas, she is due to go there in April 9th. She has vaginal dryness but denies discharge, no vaginal bleeding.  No diarrhea, some constipation but lately has been normal, easy BMs. No fever, chills. She feels more pressure with sitting, denies feeling anything "falling out of vagina" but with intercourse it is painful and feels like it is going to fall out there. Has not had PAP for several years.  States 5-6 months ago she was so constipated she had to use her finger to help her, there was gross blood in the toilet at that time, no issues since then. Bloating.   She has some epigastric/RUQ pain as well, she was seen 06/12/2017 had AB [place] 08/20/2017 that showed a mobile 1.5 cm stone but no gallbladder wall thickening and a septatee cystic structure of left hepatic lobe not previously seed, needs repeat US 02/2018. Worse with eating any food. Some nausea but no vomiting.   Last imaging of abdomen/pelvis was CT with contrast in 2006 - unremarkable other than cholelithiasis. Last colonoscopy 2014- benign polyps removed - due 2019.  Blood pressure 120/74, pulse 84, temperature 97.6 F (36.4 C), resp. rate 14, height 5\' 2"  (1.575 m), weight 108 lb 9.6 oz (49.3 kg), SpO2 97 %.  Medications Current Outpatient Medications on File Prior to Visit  Medication Sig  . ALPRAZolam (XANAX) 1 MG tablet TAKE 1/2 TO 1 (ONE-HALF  TO ONE) TABLET BY MOUTH TWICE DAILY AS NEEDED FOR ANXIETY  . meloxicam (MOBIC) 15 MG tablet Take one daily with food for 2 weeks, can take with tylenol, can not take with aleve, iburpofen, then as needed daily for pain  . venlafaxine XR (EFFEXOR XR) 37.5 MG 24 hr capsule Take 1 capsule (37.5 mg total) by mouth daily.   No current facility-administered medications on file prior to visit.     Problem list She has Anxiety; Hyperlipidemia; Vitamin D deficiency; Prediabetes; Medication management; Stress; Cognitive complaints; and Hepatic cyst on their problem list.   Review of Systems  Constitutional: Negative.   HENT: Negative.  Negative for sore throat.   Eyes: Negative.   Respiratory: Negative for cough, choking, shortness of breath and wheezing.   Cardiovascular: Negative.  Negative for chest pain.  Gastrointestinal: Positive for abdominal distention, abdominal pain, blood in stool, constipation and nausea. Negative for anal bleeding, diarrhea, rectal pain and vomiting.  Genitourinary: Negative.   Musculoskeletal: Negative.   Skin: Negative.   Neurological: Negative.   Hematological: Negative.   Psychiatric/Behavioral: The patient is nervous/anxious.        Objective:   Physical Exam  Constitutional: She is oriented to person, place, and time. She appears well-developed and well-nourished.  HENT:  Head: Normocephalic and atraumatic.  Right Ear: External ear normal.  Left Ear: External ear normal.  Mouth/Throat: Oropharynx is clear and  moist.  Eyes: Pupils are equal, round, and reactive to light. Conjunctivae and EOM are normal.  Neck: Normal range of motion. Neck supple. No thyromegaly present.  Cardiovascular: Normal rate, regular rhythm and normal heart sounds. Exam reveals no gallop and no friction rub.  No murmur heard. Pulmonary/Chest: Effort normal and breath sounds normal. No respiratory distress. She has no wheezes.  Abdominal: Soft. Bowel sounds are normal. She  exhibits no shifting dullness, no distension, no abdominal bruit, no pulsatile midline mass and no mass. There is no hepatosplenomegaly. There is tenderness in the epigastric area and suprapubic area. There is no rigidity, no rebound, no guarding, no CVA tenderness, no tenderness at McBurney's point and negative Murphy's sign. No hernia.  Musculoskeletal: Normal range of motion.  Lymphadenopathy:    She has no cervical adenopathy.  Neurological: She is alert and oriented to person, place, and time.  Skin: Skin is warm and dry.  Psychiatric: Her speech is normal and behavior is normal. Judgment and thought content normal. Her mood appears anxious. Cognition and memory are normal. She expresses no homicidal and no suicidal ideation. She expresses no suicidal plans and no homicidal plans.       Assessment & Plan:   Hepatic cyst Repeat US in 6 months  Anxiety Failed effexor, declines meds at this time but we had an extensive discussion that we do need to treat her anxiety in the future.   Urinary frequency -     Urinalysis, Routine w reflex microscopic -     Urine Culture  Generalized abdominal pain -     CBC with Differential/Platelet -     BASIC METABOLIC PANEL WITH GFR -     Hepatic function panel - if any worsening pain go to ER - due for colonoscpy will refer, did have 1 episode of blood in stool - due to see Dr. Candie Mile, normal lower AB exam- no fibroids/masses appreciated- ? If have cystocele or need vaginal [place], she wants to wait to see Dr. Candie Mile on the 9th.    Gallstones -     CBC with Differential/Platelet -     BASIC METABOLIC PANEL WITH GFR -     Hepatic function panel - will get HIDA scan, sounds like could be related to gallbladder - needs EGD especially if HIDA negative - prilosec x 2 weeks -- if any worsening pain go to ER    Future Appointments  Date Time Provider [place]  10/30/2017 11:00 AM Vicie Mutters, PA-C [facility] None

## 2017-10-08 NOTE — Patient Instructions (Addendum)
We will send you to Dr. Henrene Pastor your GI doctor, you are due for a colonoscopy but pending the HIDA scan for your gallbladder you may need something called an EGD where they look down your throat. You are also due for your colonoscopy and with your symptoms/blood in stool we should get that done.   I'm going to give you 2 weeks of a medication for your stomach pain to try.   Please follow up with Dr. Helane Rima, ask her if you may have a cystocele or if you need a vaginal [place]  We will check labs on you  We need to eventually find an anxiety medication for you, this may help some of these complaints.   Gallbladder Nuclear Scan A gallbladder nuclear scan (hepatobiliary scan or HIDA scan) is an imaging test that checks the function of your liver, your gallbladder, and the ducts of those organs. These parts make up your hepatobiliary system. You may need this scan if you have symptoms of liver or gallbladder disease. This scan is done with a camera that detects radioactive energy (gamma rays). For this exam, you will be given a radioactive substance, called a radiotracer, through an IV tube inserted in your hand or arm. As the radiotracer moves through your hepatobiliary system, the camera and a computer detect the gamma rays and form them into images that show how well your system is working. Tell a health care provider about:  Any possibility of pregnancy or if you are breastfeeding.  Any allergies you have.  All medicines you are taking, including vitamins, herbs, eye drops, creams, and over-the-counter medicines.  Any problems you or family members have had with anesthetic medicines.  Any blood disorders you have.  Any surgeries you have had.  Any medical conditions you have. What happens before the procedure?  Take medicines only as directed by your health care provider.  Your health care provider will let you know when you should start fasting. You may not be able to eat or drink anything  after midnight on the night before the procedure or for at least four hours before the test.  Do not wear jewelry to the exam.  Wear loose, comfortable clothing. You may be asked to put on a gown for the procedure. What happens during the procedure?  An IV tube will be inserted into a vein in your hand or arm. It will remain in place throughout the exam.  A small amount of the radiotracer material will be injected through your IV tube.  You may feel a cold sensation as the material runs through your IV tube.  While you are lying down, a technician will place the gamma camera over your abdomen.  The camera may rotate around your body. You may be asked to stay still or move into a certain position.  You will then be given a medicine called cholecystokinin (CCK) through your IV tube. This will make your gallbladder empty. You may feel nauseous or have cramping for a short time.  Additional images will be taken after CCK is given.  After all the images have been taken, your IV tube will be removed. What happens after the procedure?  You may resume normal activities and diet as directed by your health care provider.  The radiotracer will leave your body over the next few days. There are no long-term side effects from the radiotracer. Drink lots of fluids to help flush it out of your body.  A health care provider who  specializes in nuclear medicine will read the scan and send a report to your regular health care provider. This information is not intended to replace advice given to you by your health care provider. Make sure you discuss any questions you have with your health care provider. Document Released: 06/30/2000 Document Revised: 12/09/2015 Document Reviewed: 11/05/2013 Elsevier Interactive Patient Education  2018 [place].   Gastroesophageal Reflux Disease, Adult Normally, food travels down the esophagus and stays in the stomach to be digested. However, when a person has  gastroesophageal reflux disease (GERD), food and stomach acid move back up into the esophagus. When this happens, the esophagus becomes sore and inflamed. Over time, GERD can create small holes (ulcers) in the lining of the esophagus. What are the causes? This condition is caused by a problem with the muscle between the esophagus and the stomach (lower esophageal sphincter, or LES). Normally, the LES muscle closes after food passes through the esophagus to the stomach. When the LES is weakened or abnormal, it does not close properly, and that allows food and stomach acid to go back up into the esophagus. The LES can be weakened by certain dietary substances, medicines, and medical conditions, including:  Tobacco use.  Pregnancy.  Having a hiatal hernia.  Heavy alcohol use.  Certain foods and beverages, such as coffee, chocolate, onions, and peppermint.  What increases the risk? This condition is more likely to develop in:  People who have an increased body weight.  People who have connective tissue disorders.  People who use NSAID medicines.  What are the signs or symptoms? Symptoms of this condition include:  Heartburn.  Difficult or painful swallowing.  The feeling of having a lump in the throat.  Abitter taste in the mouth.  Bad breath.  Having a large amount of saliva.  Having an upset or bloated stomach.  Belching.  Chest pain.  Shortness of breath or wheezing.  Ongoing (chronic) cough or a night-time cough.  Wearing away of tooth enamel.  Weight loss.  Different conditions can cause chest pain. Make sure to see your health care provider if you experience chest pain. How is this diagnosed? Your health care provider will take a medical history and perform a physical exam. To determine if you have mild or severe GERD, your health care provider may also monitor how you respond to treatment. You may also have other tests, including:  An endoscopy toexamine  your stomach and esophagus with a small camera.  A test thatmeasures the acidity level in your esophagus.  A test thatmeasures how much pressure is on your esophagus.  A barium swallow or modified barium swallow to show the shape, size, and functioning of your esophagus.  How is this treated? The goal of treatment is to help relieve your symptoms and to prevent complications. Treatment for this condition may vary depending on how severe your symptoms are. Your health care provider may recommend:  Changes to your diet.  Medicine.  Surgery.  Follow these instructions at home: Diet  Follow a diet as recommended by your health care provider. This may involve avoiding foods and drinks such as: ? Coffee and tea (with or without caffeine). ? Drinks that containalcohol. ? Energy drinks and sports drinks. ? Carbonated drinks or sodas. ? Chocolate and cocoa. ? Peppermint and mint flavorings. ? Garlic and onions. ? Horseradish. ? Spicy and acidic foods, including peppers, chili powder, curry powder, vinegar, hot sauces, and barbecue sauce. ? Citrus fruit juices and citrus fruits,  such as oranges, lemons, and limes. ? Tomato-based foods, such as red sauce, chili, salsa, and pizza with red sauce. ? Fried and fatty foods, such as donuts, french fries, potato chips, and high-fat dressings. ? High-fat meats, such as hot dogs and fatty cuts of red and white meats, such as rib eye steak, sausage, ham, and bacon. ? High-fat dairy items, such as whole milk, butter, and cream cheese.  Eat small, frequent meals instead of large meals.  Avoid drinking large amounts of liquid with your meals.  Avoid eating meals during the 2-3 hours before bedtime.  Avoid lying down right after you eat.  Do not exercise right after you eat. General instructions  Pay attention to any changes in your symptoms.  Take over-the-counter and prescription medicines only as told by your health care provider. Do  not take aspirin, ibuprofen, or other NSAIDs unless your health care provider told you to do so.  Do not use any tobacco products, including cigarettes, chewing tobacco, and e-cigarettes. If you need help quitting, ask your health care provider.  Wear loose-fitting clothing. Do not wear anything tight around your waist that causes pressure on your abdomen.  Raise (elevate) the head of your bed 6 inches (15cm).  Try to reduce your stress, such as with yoga or meditation. If you need help reducing stress, ask your health care provider.  If you are overweight, reduce your weight to an amount that is healthy for you. Ask your health care provider for guidance about a safe weight loss goal.  Keep all follow-up visits as told by your health care provider. This is important. Contact a health care provider if:  You have new symptoms.  You have unexplained weight loss.  You have difficulty swallowing, or it hurts to swallow.  You have wheezing or a persistent cough.  Your symptoms do not improve with treatment.  You have a hoarse voice. Get help right away if:  You have pain in your arms, neck, jaw, teeth, or back.  You feel sweaty, dizzy, or light-headed.  You have chest pain or shortness of breath.  You vomit and your vomit looks like blood or coffee grounds.  You faint.  Your stool is bloody or black.  You cannot swallow, drink, or eat. This information is not intended to replace advice given to you by your health care provider. Make sure you discuss any questions you have with your health care provider. Document Released: 04/12/2005 Document Revised: 12/01/2015 Document Reviewed: 10/28/2014 Elsevier Interactive Patient Education  2018 [place] After being diagnosed with an anxiety disorder, you may be relieved to know why you have felt or behaved a certain way. It is natural to also feel overwhelmed about the treatment ahead and what it will mean  for your life. With care and support, you can manage this condition and recover from it. How to cope with anxiety Dealing with stress Stress is your body's reaction to life changes and events, both good and bad. Stress can last just a few hours or it can be ongoing. Stress can play a major role in anxiety, so it is important to learn both how to cope with stress and how to think about it differently. Talk with your health care provider or a counselor to learn more about stress reduction. He or she may suggest some stress reduction techniques, such as:  Music therapy. This can include creating or listening to music that you enjoy and that inspires  you.  Mindfulness-based meditation. This involves being aware of your normal breaths, rather than trying to control your breathing. It can be done while sitting or walking.  Centering prayer. This is a kind of meditation that involves focusing on a word, phrase, or sacred image that is meaningful to you and that brings you peace.  Deep breathing. To do this, expand your stomach and inhale slowly through your nose. Hold your breath for 3-5 seconds. Then exhale slowly, allowing your stomach muscles to relax.  Self-talk. This is a skill where you identify thought patterns that lead to anxiety reactions and correct those thoughts.  Muscle relaxation. This involves tensing muscles then relaxing them.  Choose a stress reduction technique that fits your lifestyle and personality. Stress reduction techniques take time and practice. Set aside 5-15 minutes a day to do them. Therapists can offer training in these techniques. The training may be covered by some insurance plans. Other things you can do to manage stress include:  Keeping a stress diary. This can help you learn what triggers your stress and ways to control your response.  Thinking about how you respond to certain situations. You may not be able to control everything, but you can control your  reaction.  Making time for activities that help you relax, and not feeling guilty about spending your time in this way.  Therapy combined with coping and stress-reduction skills provides the best chance for successful treatment. Medicines Medicines can help ease symptoms. Medicines for anxiety include:  Anti-anxiety drugs.  Antidepressants.  Beta-blockers.  Medicines may be used as the main treatment for anxiety disorder, along with therapy, or if other treatments are not working. Medicines should be prescribed by a health care provider. Relationships Relationships can play a big part in helping you recover. Try to spend more time connecting with trusted friends and family members. Consider going to couples counseling, taking family education classes, or going to family therapy. Therapy can help you and others better understand the condition. How to recognize changes in your condition Everyone has a different response to treatment for anxiety. Recovery from anxiety happens when symptoms decrease and stop interfering with your daily activities at home or work. This may mean that you will start to:  Have better concentration and focus.  Sleep better.  Be less irritable.  Have more energy.  Have improved memory.  It is important to recognize when your condition is getting worse. Contact your health care provider if your symptoms interfere with home or work and you do not feel like your condition is improving. Where to find help and support: You can get help and support from these sources:  Self-help groups.  Online and [employer].  A trusted spiritual leader.  Couples counseling.  Family education classes.  Family therapy.  Follow these instructions at home:  Eat a healthy diet that includes plenty of vegetables, fruits, whole grains, low-fat dairy products, and lean protein. Do not eat a lot of foods that are high in solid fats, added sugars, or  salt.  Exercise. Most adults should do the following: ? Exercise for at least 150 minutes each week. The exercise should increase your heart rate and make you sweat (moderate-intensity exercise). ? Strengthening exercises at least twice a week.  Cut down on caffeine, tobacco, alcohol, and other potentially harmful substances.  Get the right amount and quality of sleep. Most adults need 7-9 hours of sleep each night.  Make choices that simplify your life.  Take over-the-counter  and prescription medicines only as told by your health care provider.  Avoid caffeine, alcohol, and certain over-the-counter cold medicines. These may make you feel worse. Ask your pharmacist which medicines to avoid.  Keep all follow-up visits as told by your health care provider. This is important. Questions to ask your health care provider  Would I benefit from therapy?  How often should I follow up with a health care provider?  How long do I need to take medicine?  Are there any long-term side effects of my medicine?  Are there any alternatives to taking medicine? Contact a health care provider if:  You have a hard time staying focused or finishing daily tasks.  You spend many hours a day feeling worried about everyday life.  You become exhausted by worry.  You start to have headaches, feel tense, or have nausea.  You urinate more than normal.  You have diarrhea. Get help right away if:  You have a racing heart and shortness of breath.  You have thoughts of hurting yourself or others. If you ever feel like you may hurt yourself or others, or have thoughts about taking your own life, get help right away. You can go to your nearest emergency department or call:  Your local emergency services (911 in the [place]  A suicide crisis helpline, such as the [place] at 2898886216. This is open 24-hours a day.  Summary  Taking steps to deal with stress can help calm  you.  Medicines cannot cure anxiety disorders, but they can help ease symptoms.  Family, friends, and partners can play a big part in helping you recover from an anxiety disorder. This information is not intended to replace advice given to you by your health care provider. Make sure you discuss any questions you have with your health care provider. Document Released: 06/27/2016 Document Revised: 06/27/2016 Document Reviewed: 06/27/2016 Elsevier Interactive Patient Education  [employer].

## 2017-10-09 LAB — URINALYSIS, ROUTINE W REFLEX MICROSCOPIC
BILIRUBIN URINE: NEGATIVE
GLUCOSE, UA: NEGATIVE
Hgb urine dipstick: NEGATIVE
Ketones, ur: NEGATIVE
Leukocytes, UA: NEGATIVE
Nitrite: NEGATIVE
PROTEIN: NEGATIVE
SPECIFIC GRAVITY, URINE: 1.012 (ref 1.001–1.03)
pH: 5.5 (ref 5.0–8.0)

## 2017-10-09 LAB — CBC WITH DIFFERENTIAL/PLATELET
Basophils Absolute: 41 cells/uL (ref 0–200)
Basophils Relative: 0.6 %
EOS PCT: 1.2 %
Eosinophils Absolute: 82 cells/uL (ref 15–500)
HCT: 43.9 % (ref 35.0–45.0)
Hemoglobin: 14.6 g/dL (ref 11.7–15.5)
Lymphs Abs: 2380 cells/uL (ref 850–3900)
MCH: 29.4 pg (ref 27.0–33.0)
MCHC: 33.3 g/dL (ref 32.0–36.0)
MCV: 88.5 fL (ref 80.0–100.0)
MPV: 9.9 fL (ref 7.5–12.5)
Monocytes Relative: 7.8 %
NEUTROS PCT: 55.4 %
Neutro Abs: 3767 cells/uL (ref 1500–7800)
PLATELETS: 237 10*3/uL (ref 140–400)
RBC: 4.96 10*6/uL (ref 3.80–5.10)
RDW: 13 % (ref 11.0–15.0)
TOTAL LYMPHOCYTE: 35 %
WBC mixed population: 530 cells/uL (ref 200–950)
WBC: 6.8 10*3/uL (ref 3.8–10.8)

## 2017-10-09 LAB — BASIC METABOLIC PANEL WITH GFR
BUN: 9 mg/dL (ref 7–25)
CALCIUM: 9.8 mg/dL (ref 8.6–10.4)
CO2: 29 mmol/L (ref 20–32)
Chloride: 106 mmol/L (ref 98–110)
Creat: 0.69 mg/dL (ref 0.50–1.05)
GFR, EST AFRICAN AMERICAN: 112 mL/min/{1.73_m2} (ref >=60)
GFR, Est Non African American: 97 mL/min/{1.73_m2} (ref >=60)
Glucose, Bld: 92 mg/dL (ref 65–99)
POTASSIUM: 3.9 mmol/L (ref 3.5–5.3)
SODIUM: 142 mmol/L (ref 135–146)

## 2017-10-09 LAB — HEPATIC FUNCTION PANEL
AG Ratio: 1.8 (calc) (ref 1.0–2.5)
ALBUMIN MSPROF: 4.8 g/dL (ref 3.6–5.1)
ALKALINE PHOSPHATASE (APISO): 115 U/L (ref 33–130)
ALT: 10 U/L (ref 6–29)
AST: 21 U/L (ref 10–35)
BILIRUBIN DIRECT: 0.1 mg/dL (ref 0.0–0.2)
Globulin: 2.7 g/dL (calc) (ref 1.9–3.7)
Indirect Bilirubin: 0.4 mg/dL (calc) (ref 0.2–1.2)
Total Bilirubin: 0.5 mg/dL (ref 0.2–1.2)
Total Protein: 7.5 g/dL (ref 6.1–8.1)

## 2017-10-09 LAB — URINE CULTURE
MICRO NUMBER: 90370983
SPECIMEN QUALITY:: ADEQUATE

## 2017-10-11 ENCOUNTER — Encounter: Payer: Self-pay | Admitting: Internal Medicine

## 2017-10-16 ENCOUNTER — Ambulatory Visit (HOSPITAL_COMMUNITY)
Admission: RE | Admit: 2017-10-16 | Discharge: 2017-10-16 | Disposition: A | Discharge status: Home / Self Care | Payer: BLUE CROSS/BLUE SHIELD | Source: Ambulatory Visit | Attending: Physician Assistant | Admitting: Physician Assistant

## 2017-10-16 ENCOUNTER — Ambulatory Visit (HOSPITAL_COMMUNITY): Payer: BLUE CROSS/BLUE SHIELD

## 2017-10-16 DIAGNOSIS — R112 Nausea with vomiting, unspecified: Secondary | ICD-10-CM | POA: Diagnosis present

## 2017-10-16 DIAGNOSIS — R1011 Right upper quadrant pain: Secondary | ICD-10-CM | POA: Insufficient documentation

## 2017-10-16 MED ORDER — TECHNETIUM TC 99M MEBROFENIN IV KIT
5.3000 | PACK | Freq: Once | INTRAVENOUS | Status: AC | PRN
  Administered 2017-10-16: 5.3 via INTRAVENOUS

## 2017-10-23 LAB — HM MAMMOGRAPHY: HM Mammogram: NORMAL (ref 0–4)

## 2017-10-23 LAB — HM PAP SMEAR: HM Pap smear: NEGATIVE

## 2017-10-28 NOTE — Progress Notes (Signed)
Patient ID: Kayla Ware, female   DOB: 08-Oct-1960, 57 y.o.   MRN: [id number]  Complete Physical  Assessment and Plan:  Hepatic cyst Needs repeat AB [place] in Nov  Hyperlipidemia, unspecified hyperlipidemia type -continue medications, check lipids, decrease fatty foods, increase activity.   Medication management  Cognitive complaints Has improved, suggest daily anxiety medication, she declines at this time  Vitamin D deficiency Continue supplement  Anxiety suggest daily anxiety medication, she declines at this time LONG discussion that xanax is addictive, do not take daily, if ever increases use will have to get on daily med/see psych  Body mass index (BMI) less than or equal to 19 in adult Monitor  Has had recent labs and through work ups- patient declines labs today Follow up 6 months Discussed med's effects and SE's. Screening labs and tests as requested with regular follow-up as recommended. Future Appointments  Date Time Provider [place]  11/06/2018 10:00 AM Vicie Mutters, PA-C [facility] None    HPI  57 y.o. female  presents for a complete physical.  Her blood pressure has been controlled at home, today their BP is BP: 116/70.  She does not workout.  She is always constantly busy and does a lot of gardening.   She denies chest pain, shortness of breath, dizziness.   She is not on cholesterol medication and denies myalgias. Her cholesterol is at goal. The cholesterol last visit was:  Lab Results  Component Value Date   CHOL 169 11/10/2015   HDL 49 11/10/2015   LDLCALC 106 11/10/2015   TRIG 68 11/10/2015   CHOLHDL 3.4 11/10/2015   Patient is on Vitamin D supplement.   Lab Results  Component Value Date   VD25OH 36 11/10/2015     She does reports some anxiety, long standing history but declines medications. She had memory issues in the past with normal neuro work up with Dr. Jaynee Eagles. Could not tolerate prozac. She is on xanax, 1/2 in the morning, and  1/2 at lunch and 1/2 at dinner.  She also has severe seasonal depression, better with tanning- interested in getting light therapy lamp.   She also has chronic AB pain- following with Dr. Bonner Puna and had normal exam April 0.   She has some epigastric/RUQ pain as well, she was seen 06/12/2017 had AB [place] 08/20/2017 that showed a mobile 1.5 cm stone but no gallbladder wall thickening and a septatee cystic structure of left hepatic lobe not previously seed, needs repeat US 02/2018. Worse with eating any food. Some nausea but no vomiting. Had normal HIDA scan recently. Continue to have RUQ pain with pain into her back.   Last imaging of abdomen/pelvis was CT with contrast in 2006 - unremarkable other than cholelithiasis.Last colonoscopy 2014- benign polyps removed - due 2019.  She has seen ENT 02/2017 Dr. Ernesto Rutherford, some ear pain and fullness, states allergy pill not helping- will add on nasonex.   BMI is Body mass index is 19.38 kg/m., she is working on diet and exercise. Wt Readings from Last 3 Encounters:  10/30/17 109 lb 6.4 oz (49.6 kg)  10/08/17 108 lb 9.6 oz (49.3 kg)  06/21/17 110 lb 6.4 oz (50.1 kg)    Current Medications:  Current Outpatient Medications on File Prior to Visit  Medication Sig Dispense Refill  . ALPRAZolam (XANAX) 1 MG tablet TAKE 1/2 TO 1 (ONE-HALF TO ONE) TABLET BY MOUTH TWICE DAILY AS NEEDED FOR ANXIETY 180 tablet 0   No current facility-administered medications on file prior  to visit.     Health Maintenance:   Immunization History  Administered Date(s) Administered  . Tdap 10/28/2012   TDAP 2014 Influenza declines  Colonoscopy Dr. Henrene Pastor 2014 CTA head 01/2017 CT chest 2014 CXR 2014 MGM AT [facility]- April 9th 2019 PAP 2019 at GYN HIDA 10/2017 [place] 08/2017  Patient Care Team: Unk Pinto, MD as PCP - General (Internal Medicine) Jerrell Belfast, MD as Consulting Physician (Otolaryngology) Dian Queen, MD as Consulting Physician (Obstetrics and  Gynecology) Irene Shipper, MD as Consulting Physician (Gastroenterology)  Medical History:  Past Medical History:  Diagnosis Date  . Anxiety   . Depression   . Gall stones   . Heart murmur   . Hyperlipidemia   . Unspecified vitamin D deficiency    Allergies Allergies  Allergen Reactions  . Citalopram Other (See Comments)    FATIGUE   . Erythromycin Nausea And Vomiting  . Meloxicam Other (See Comments)    GI UPSET  . Propofol Other (See Comments)    TINNITUS  . Codeine Nausea And Vomiting    SURGICAL HISTORY She  has a past surgical history that includes none and bo surgical history. FAMILY HISTORY Her family history includes Arthritis in her brother; Birth defects in her maternal grandmother; Cancer in her maternal aunt and maternal grandmother; Colon cancer (age of onset: 6) in her paternal aunt; Colon polyps in her father; Depression in her father; Diabetes in her paternal grandmother; Hyperlipidemia in her father. SOCIAL HISTORY She  reports that she has never smoked. She has never used smokeless tobacco. She reports that she drinks about 3.6 oz of alcohol per week. She reports that she does not use drugs.  Review of Systems: Review of Systems  Constitutional: Negative for chills, diaphoresis, fever, malaise/fatigue and weight loss.  HENT: Negative for congestion, ear pain, sore throat and tinnitus.   Eyes: Negative.   Respiratory: Negative for cough, shortness of breath and wheezing.   Cardiovascular: Negative for chest pain, palpitations and leg swelling.  Gastrointestinal: Positive for constipation (miralax). Negative for blood in stool, diarrhea, heartburn, melena, nausea and vomiting.  Genitourinary: Negative for dysuria, frequency, hematuria and urgency.  Musculoskeletal: Negative.   Skin: Negative.   Neurological: Negative for dizziness, tingling, tremors, loss of consciousness and headaches.  Psychiatric/Behavioral: Negative for depression. The patient is  nervous/anxious and has insomnia.     Physical Exam: Estimated body mass index is 19.38 kg/m as calculated from the following:   Height as of this encounter: 5\' 3"  (1.6 m).   Weight as of this encounter: 109 lb 6.4 oz (49.6 kg). BP 116/70   Pulse 64   Temp 97.6 F (36.4 C)   Resp 16   Ht 5\' 3"  (1.6 m)   Wt 109 lb 6.4 oz (49.6 kg)   SpO2 98%   BMI 19.38 kg/m   General Appearance: Well nourished well developed, in no apparent distress.  Eyes: PERRLA, EOMs, conjunctiva no swelling or erythema ENT/Mouth: Ear canals normal without obstruction, swelling, erythema, or discharge.  TMs normal bilaterally with no erythema, bulging, retraction, or loss of landmark.  Oropharynx moist and clear with no exudate, erythema, or swelling.   Neck: Supple, thyroid normal. No bruits.  No cervical adenopathy Respiratory: Respiratory effort normal, Breath sounds clear A&P without wheeze, rhonchi, rales.   Cardio: RRR without murmurs, rubs or gallops. Brisk peripheral pulses without edema.  Chest: symmetric, with normal excursions Breasts: Symmetric, without lumps, nipple discharge, retractions.  Abdomen: Soft, nontender, no guarding, rebound, hernias,  masses, or organomegaly.  Lymphatics: Non tender without lymphadenopathy.  Musculoskeletal: Full ROM all peripheral extremities,5/5 strength, and normal gait.  Skin: Warm, dry without rashes, lesions, ecchymosis. Neuro: Awake and oriented X 3, Cranial nerves intact, reflexes equal bilaterally. Normal muscle tone, no cerebellar symptoms. Sensation intact.  Psych:  Depressed affect, Insight and Judgment appropriate.   EKG: defer  AORTA SCAN: defer   Over 40 minutes of exam, counseling, chart review and critical decision making was performed  Vicie Mutters 11:17 AM [facility]

## 2017-10-30 ENCOUNTER — Ambulatory Visit: Payer: BLUE CROSS/BLUE SHIELD | Admitting: Physician Assistant

## 2017-10-30 ENCOUNTER — Encounter: Payer: Self-pay | Admitting: Physician Assistant

## 2017-10-30 VITALS — BP 116/70 | HR 64 | Temp 97.6°F | Resp 16 | Ht 63.0 in | Wt 109.4 lb

## 2017-10-30 DIAGNOSIS — F439 Reaction to severe stress, unspecified: Secondary | ICD-10-CM

## 2017-10-30 DIAGNOSIS — Z681 Body mass index (BMI) 19 or less, adult: Secondary | ICD-10-CM

## 2017-10-30 DIAGNOSIS — E559 Vitamin D deficiency, unspecified: Secondary | ICD-10-CM

## 2017-10-30 DIAGNOSIS — Z0000 Encounter for general adult medical examination without abnormal findings: Secondary | ICD-10-CM

## 2017-10-30 DIAGNOSIS — K7689 Other specified diseases of liver: Secondary | ICD-10-CM

## 2017-10-30 DIAGNOSIS — Z79899 Other long term (current) drug therapy: Secondary | ICD-10-CM

## 2017-10-30 DIAGNOSIS — R419 Unspecified symptoms and signs involving cognitive functions and awareness: Secondary | ICD-10-CM

## 2017-10-30 DIAGNOSIS — F419 Anxiety disorder, unspecified: Secondary | ICD-10-CM

## 2017-10-30 DIAGNOSIS — E785 Hyperlipidemia, unspecified: Secondary | ICD-10-CM

## 2017-10-30 DIAGNOSIS — R7303 Prediabetes: Secondary | ICD-10-CM

## 2017-10-30 NOTE — Patient Instructions (Addendum)
New guidelines suggest the benzodiazepines are best short term, with prolonged use they lead to physical and psychological dependence. In addition, evidence suggest that for insomnia the effectiveness wanes in 4 weeks and the risks out weight their benefits. Use of these agents have been associated with dementia, falls, motor vehicle accidents and physical addiction. Decreasing these medication have been proven to show improvements in cognition, alertness, decrease of falls and daytime sedation.   We will start a slow taper, symptoms of withdrawal include, insomnia, anxiety, irritability, sweating and stomach or intestinal symptoms like diarrhea or nausea.   Try to NOT take xanax daily, if the use ever increases we will need to do a daily medication.  Your ears and sinuses are connected by the eustachian tube. When your sinuses are inflamed, this can close off the tube and cause fluid to collect in your middle ear. This can then cause dizziness, popping, clicking, ringing, and echoing in your ears. This is often NOT an infection and does NOT require antibiotics, it is caused by inflammation so the treatments help the inflammation. This can take a long time to get better so please be patient.  Here are things you can do to help with this:  - Try the Flonase or Nasonex. Remember to spray each nostril twice towards the outer part of your eye.  Do not sniff but instead pinch your nose and tilt your head back to help the medicine get into your sinuses.  The best time to do this is at bedtime.Stop if you get blurred vision or nose bleeds.   -While drinking fluids, pinch and hold nose close and swallow, to help open eustachian tubes to drain fluid behind ear drums.  -can use decongestant over the counter, please do not use if you have high blood pressure or certain heart conditions.   if worsening HA, changes vision/speech, imbalance, weakness go to the ER   Counseling services  Here are some numbers  below you can try but I suggest calling your insurance and finding out who is in your network and THEN calling those people or looking them up on google.   I'm a big fan of Cognitive Behavioral Therapy, look this up on You tube or check with the therapist you see if they are certified.  This form of therapy helps to teach you skills to better handle with current situation that are causing anxiety or depression.

## 2017-11-19 ENCOUNTER — Other Ambulatory Visit: Payer: Self-pay | Admitting: Physician Assistant

## 2017-11-19 DIAGNOSIS — F419 Anxiety disorder, unspecified: Secondary | ICD-10-CM

## 2017-11-27 ENCOUNTER — Ambulatory Visit: Payer: BLUE CROSS/BLUE SHIELD | Admitting: Internal Medicine

## 2017-12-03 ENCOUNTER — Encounter: Payer: Self-pay | Admitting: Internal Medicine

## 2017-12-13 ENCOUNTER — Ambulatory Visit: Payer: BLUE CROSS/BLUE SHIELD | Admitting: Internal Medicine

## 2017-12-13 ENCOUNTER — Encounter: Payer: Self-pay | Admitting: Internal Medicine

## 2017-12-13 VITALS — BP 96/62 | HR 68 | Temp 97.2°F | Resp 16 | Ht 63.0 in | Wt 109.6 lb

## 2017-12-13 DIAGNOSIS — H6523 Chronic serous otitis media, bilateral: Secondary | ICD-10-CM

## 2017-12-13 MED ORDER — DEXAMETHASONE 0.5 MG PO TABS: ORAL_TABLET | ORAL | 0 refills | Status: DC

## 2017-12-13 NOTE — Progress Notes (Signed)
  Subjective:    Patient ID: Kayla Ware, female    DOB: Dec 16, 1960, 57 y.o.   MRN: [id number]  HPI  Patient is a nice 57 yo with a neg PMH who presents with c/o fullness, intermittent roaring in ears. Has hx/o intermittent high pitched tinnitus.   Outpatient Medications Prior to Visit  Medication Sig Dispense Refill  . ALPRAZolam (XANAX) 1 MG tablet TAKE 1/2 TO 1 (ONE-HALF TO ONE) TABLET BY MOUTH TWICE DAILY AS NEEDED FOR ANXIETY 180 tablet 0   No facility-administered medications prior to visit.    Allergies  Allergen Reactions  . Citalopram Other (See Comments)    FATIGUE   . Erythromycin Nausea And Vomiting  . Meloxicam Other (See Comments)    GI UPSET  . Propofol Other (See Comments)    TINNITUS  . Codeine Nausea And Vomiting   Past Medical History:  Diagnosis Date  . Anxiety   . Depression   . Gall stones   . Heart murmur   . Hyperlipidemia   . Unspecified vitamin D deficiency    Review of Systems  10 point systems review negative except as above.    Objective:   Physical Exam  BP 96/62   Pulse 68   Temp (!) 97.2 F (36.2 C)   Resp 16   Ht 5\' 3"  (1.6 m)   Wt 109 lb 9.6 oz (49.7 kg)   BMI 19.41 kg/m   HEENT - EAC's - Nl & TM's retracted Lt > Rt  W/Nl color. No sinus tenderness. N/O/P - clear. Neck - supple.  Chest - Clear equal BS. Cor - Nl HS. RRR w/o sig MGR. PP 1(+). No edema. MS- FROM w/o deformities.  Gait Nl. Neuro -  Nl w/o focal abnormalities.    Assessment & Plan:   1. Bilateral chronic serous otitis media  - dexamethasone (DECADRON) 0.5 MG tablet; Take 1 tab 3 x day - 3 days, then 2 x day - 3 days, then 1 tab daily  Dispense: 20 tablet; Refill: 0  -  Recommended sign for Pseudoephedrine 120 mg bid at [place] if sx's not resolve, to call for ENT referral

## 2018-04-18 ENCOUNTER — Other Ambulatory Visit: Payer: Self-pay | Admitting: Physician Assistant

## 2018-04-18 DIAGNOSIS — F419 Anxiety disorder, unspecified: Secondary | ICD-10-CM

## 2018-04-25 IMAGING — CT CT ANGIO HEAD
2 of 11 series · 13 of 47 positions shown · IV contrast (75CC ISOVUE 370)
Comparison: MRA 01/14/2017

CLINICAL DATA: Pulsatile tinnitus.  Inconclusive MRA

EXAM:
CT ANGIOGRAPHY HEAD
TECHNIQUE: Multidetector CT imaging of the head was performed using the
standard protocol during bolus administration of intravenous
contrast. Multiplanar CT image reconstructions and MIPs were
obtained to evaluate the vascular anatomy.
CONTRAST:  75 mL Isovue 370 IV

[Series 501: axial thin · axial · 0.49mm/px · z∈[+174,+307]mm · 12 of 159 slices shown]
[im 13/159  brain]
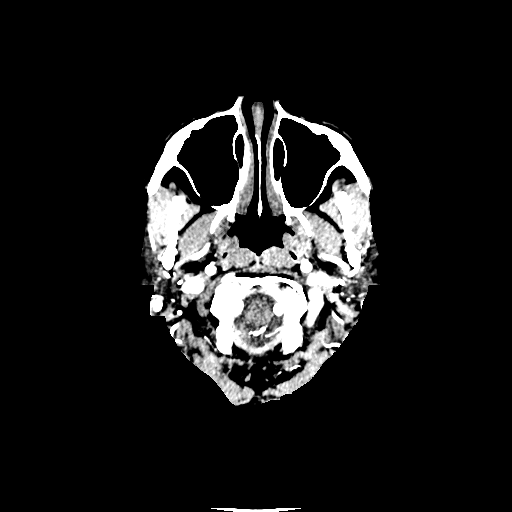
[im 25/159  bone]
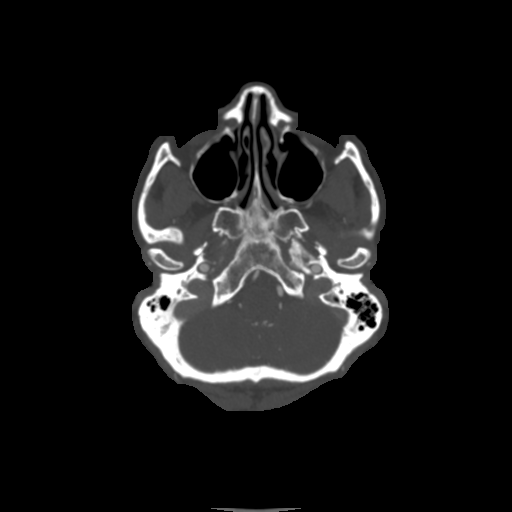
[im 37/159  brain]
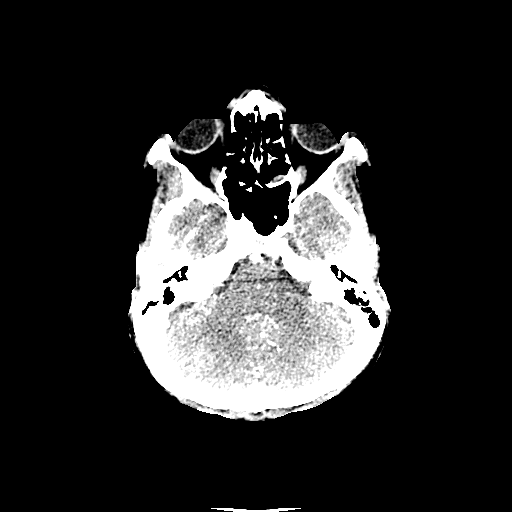
[im 49/159  bone]
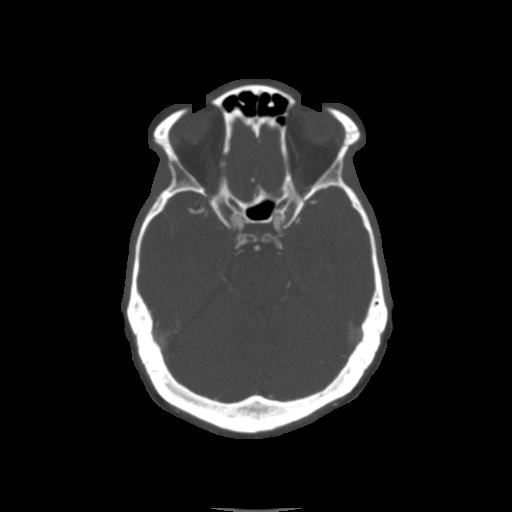
[im 61/159  brain]
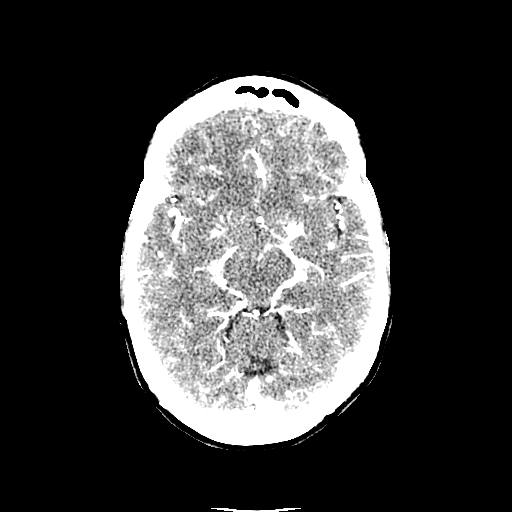
[im 73/159  bone]
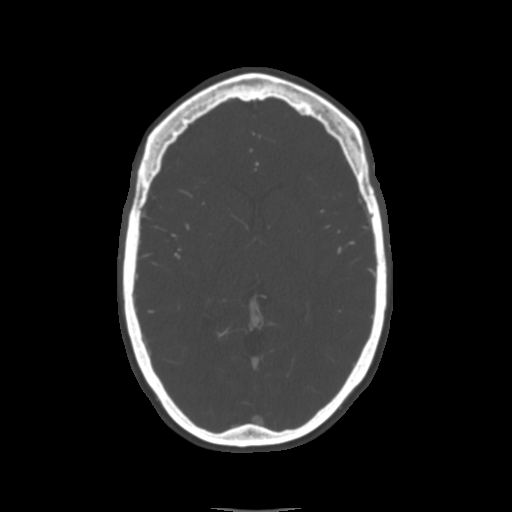
[im 86/159  brain]
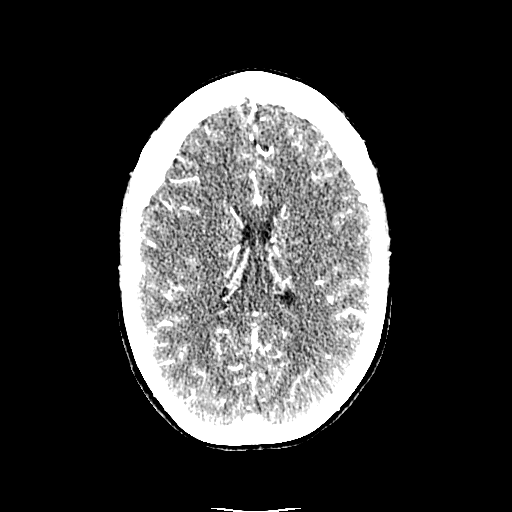
[im 98/159  bone]
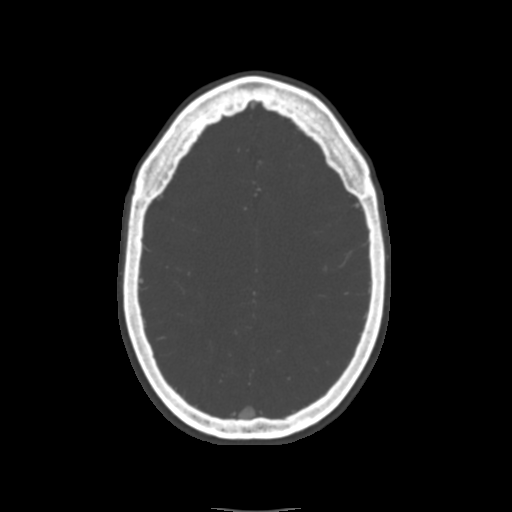
[im 110/159  brain]
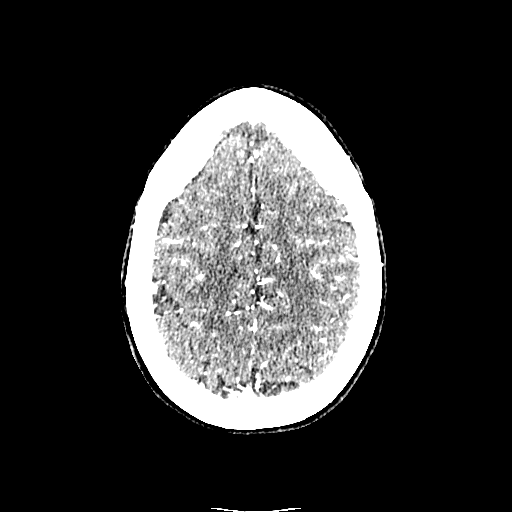
[im 122/159  bone]
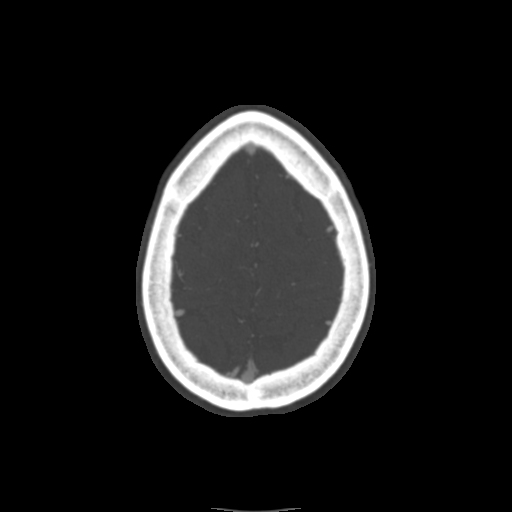
[im 134/159  brain]
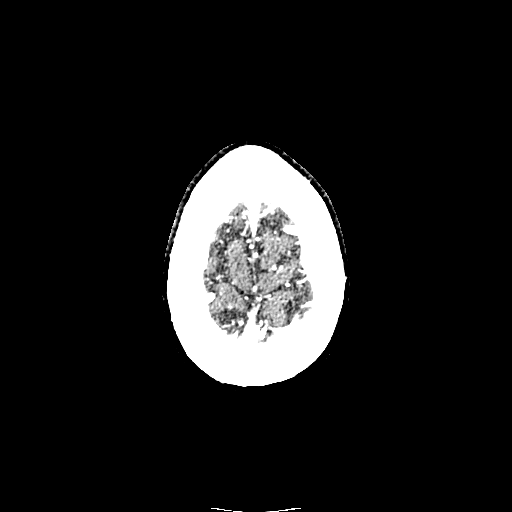
[im 146/159  bone]
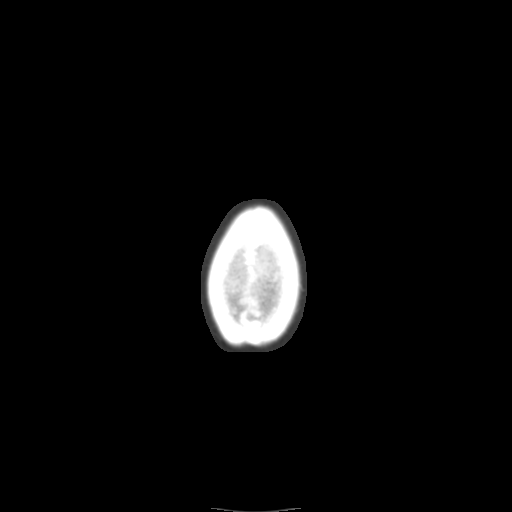

[Series 601: coronal brain · coronal · 0.49mm/px · 1 of 65 slices shown]
[im 33/65  brain]
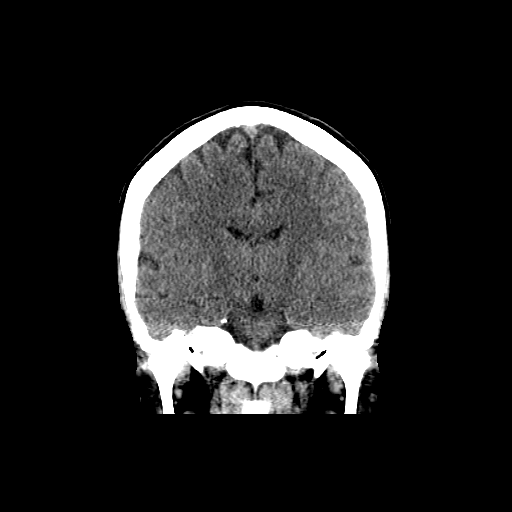

[13 of 47 positions shown; findings below may reference images not displayed]

FINDINGS: CT HEAD

Brain: No evidence of acute infarction, hemorrhage, hydrocephalus,
extra-axial collection or mass lesion/mass effect.

Vascular: No hyperdense vessel or unexpected calcification.

Skull: Negative

Sinuses: Negative

Orbits: Negative

CTA HEAD

Anterior circulation: Cavernous carotid appears normal bilaterally.
Right posterior communicating artery patent. Tiny infundibulum left
posterior communicating artery origin. Negative for aneurysm.

Anterior and middle cerebral arteries appear normal bilaterally.

Posterior circulation: Both vertebral arteries appear normal.
Basilar is normal. PICA, superior cerebellar, and posterior cerebral
arteries widely patent bilaterally. Negative for aneurysm or
vascular malformation.

Venous sinuses: Patent

Anatomic variants: None

Delayed phase: Normal enhancement.
IMPRESSION: Negative CTA head.  Negative for aneurysm or arterial stenosis.

## 2018-04-28 NOTE — Progress Notes (Deleted)
Patient ID: MALONI MUSLEH, female   DOB: 06-14-1961, 57 y.o.   MRN: [id number]  Assessment and Plan:  Anxiety Follow up at [facility] and message how she is doing Effexor 37.5mg  to try  Cholesterol: -Continue diet and exercise.  -Check cholesterol next OV  Pre-diabetes: -Continue diet and exercise.  -Check A1C next OV  Vitamin D Def: -check level -continue medications.   Back pain Try mobic If not better will refer to rotho No red flag symptoms  Continue diet and meds as discussed. Further disposition pending results of labs. Future Appointments  Date Time Provider [place]  04/30/2018  9:45 AM Vicie Mutters, PA-C [facility] None  11/06/2018 10:00 AM Vicie Mutters, PA-C [facility] None    HPI 57 y.o. female  presents for 3 month follow up with hypertension, hyperlipidemia, prediabetes and vitamin D.   Her blood pressure has been controlled at home, today their BP is  .   She does workout. She denies chest pain, shortness of breath, dizziness.  She reports that she is still working 3 jobs and is gardening.   Patient has anxiety, had memory issues/anxiety, normal neuro workup with Dr. Jaynee Eagles. Could not tolerate prozac.  She has right hip pain, lower back pain in the middle of her butt, hurts when she sits on hard chair, stands for long time. No pain down her legs, no numbness, tingling in her legs.   She is on cholesterol medication and denies myalgias. Her cholesterol is at goal. The cholesterol last visit was:   Lab Results  Component Value Date   CHOL 169 11/10/2015   HDL 49 11/10/2015   LDLCALC 106 11/10/2015   TRIG 68 11/10/2015   CHOLHDL 3.4 11/10/2015    She has been working on diet and exercise for prediabetes, and denies foot ulcerations, hyperglycemia, hypoglycemia , increased appetite, nausea, paresthesia of the feet, polydipsia, polyuria, visual disturbances, vomiting and weight loss. Last A1C in the office was:  Lab Results  Component Value Date   HGBA1C 5.6 11/10/2015   Patient is NOT on Vitamin D supplement, getting DEXA Tuesday.  Lab Results  Component Value Date   VD25OH 59 11/10/2015   BMI is There is no height or weight on file to calculate BMI., she is working on diet and exercise. Wt Readings from Last 3 Encounters:  12/13/17 109 lb 9.6 oz (49.7 kg)  10/30/17 109 lb 6.4 oz (49.6 kg)  10/08/17 108 lb 9.6 oz (49.3 kg)      Current Medications:  Current Outpatient Medications on File Prior to Visit  Medication Sig Dispense Refill  . ALPRAZolam (XANAX) 1 MG tablet TAKE 1/2 TO 1 (ONE-HALF TO ONE) TABLET BY MOUTH TWICE DAILY AS NEEDED FOR ANXIETY 180 tablet 0  . dexamethasone (DECADRON) 0.5 MG tablet Take 1 tab 3 x day - 3 days, then 2 x day - 3 days, then 1 tab daily 20 tablet 0   No current facility-administered medications on file prior to visit.     Medical History:  Past Medical History:  Diagnosis Date  . Anxiety   . Depression   . Gall stones   . Heart murmur   . Hyperlipidemia   . Unspecified vitamin D deficiency     Allergies:  Allergies  Allergen Reactions  . Citalopram Other (See Comments)    FATIGUE   . Erythromycin Nausea And Vomiting  . Meloxicam Other (See Comments)    GI UPSET  . Propofol Other (See Comments)    TINNITUS  .  Codeine Nausea And Vomiting     Review of Systems:  Review of Systems  Constitutional: Negative for chills, fever and malaise/fatigue.  HENT: Negative for congestion, ear pain and sore throat.   Eyes: Negative for blurred vision and double vision.  Respiratory: Negative for cough, shortness of breath and wheezing.   Cardiovascular: Negative for chest pain, palpitations and leg swelling.  Gastrointestinal: Negative for blood in stool, constipation, diarrhea, heartburn and melena.  Genitourinary: Negative.   Musculoskeletal: Positive for back pain.  Skin: Negative.   Neurological: Negative for dizziness and loss of consciousness.  Psychiatric/Behavioral: Negative  for depression. The patient is nervous/anxious. The patient does not have insomnia.     Family history- Review and unchanged  Social history- Review and unchanged  Physical Exam: There were no vitals taken for this visit. Wt Readings from Last 3 Encounters:  12/13/17 109 lb 9.6 oz (49.7 kg)  10/30/17 109 lb 6.4 oz (49.6 kg)  10/08/17 108 lb 9.6 oz (49.3 kg)    General Appearance: Well nourished well developed, in no apparent distress. Eyes: PERRLA, EOMs, conjunctiva no swelling or erythema ENT/Mouth: Ear canals normal without obstruction, swelling, erythma, discharge.  TMs normal bilaterally.  Oropharynx moist, clear, without exudate, or postoropharyngeal swelling. Neck: Supple, thyroid normal,no cervical adenopathy  Respiratory: Respiratory effort normal, Breath sounds clear A&P without rhonchi, wheeze, or rale.  No retractions, no accessory usage. Cardio: RRR with no MRGs. Brisk peripheral pulses without edema.  Abdomen: Soft, + BS,  Non tender, no guarding, rebound, hernias, masses. Musculoskeletal: Full ROM, 5/5 strength, Normal gait Skin: Warm, dry without rashes, lesions, ecchymosis.  Neuro: Awake and oriented X 3, Cranial nerves intact. Normal muscle tone, no cerebellar symptoms. Psych: Normal affect, Insight and Judgment appropriate.    Vicie Mutters, PA-C 9:09 AM [facility]

## 2018-04-30 ENCOUNTER — Ambulatory Visit: Payer: Self-pay | Admitting: Physician Assistant

## 2018-05-09 ENCOUNTER — Encounter: Payer: Self-pay | Admitting: Physician Assistant

## 2018-05-09 ENCOUNTER — Ambulatory Visit: Payer: BLUE CROSS/BLUE SHIELD | Admitting: Physician Assistant

## 2018-05-09 VITALS — BP 112/66 | HR 68 | Temp 97.7°F | Resp 16 | Ht 63.0 in | Wt 112.6 lb

## 2018-05-09 DIAGNOSIS — M79641 Pain in right hand: Secondary | ICD-10-CM | POA: Diagnosis not present

## 2018-05-09 DIAGNOSIS — M79642 Pain in left hand: Secondary | ICD-10-CM

## 2018-05-09 DIAGNOSIS — H9203 Otalgia, bilateral: Secondary | ICD-10-CM

## 2018-05-09 NOTE — Progress Notes (Signed)
   Subjective:    Patient ID: Kayla Ware, female    DOB: [birth year], 57 y.o.   MRN: [id number]  HPI 57 y.o. WF presents with bilateral ear pain intermittently for last week.   Still has tinnitus, no decreased hearing, no fever, chills.   She has bilateral hand pain at her thumbs, has seen ortho in the past 6 years ago. Wants to see Dr. Amedeo Plenty again.   Blood pressure 112/66, pulse 68, temperature 97.7 F (36.5 C), resp. rate 16, height 5\' 3"  (1.6 m), weight 112 lb 9.6 oz (51.1 kg), SpO2 99 %.  Medications Current Outpatient Medications on File Prior to Visit  Medication Sig  . ALPRAZolam (XANAX) 1 MG tablet TAKE 1/2 TO 1 (ONE-HALF TO ONE) TABLET BY MOUTH TWICE DAILY AS NEEDED FOR ANXIETY   No current facility-administered medications on file prior to visit.     Problem list She has Anxiety; Hyperlipidemia; Vitamin D deficiency; Prediabetes; Medication management; Stress; Cognitive complaints; and Hepatic cyst on their problem list.  Review of Systems See HPI    Objective:   Physical Exam  Constitutional: She is oriented to person, place, and time. She appears well-developed and well-nourished.  HENT:  Head: Normocephalic and atraumatic.  Right Ear: Hearing and external ear normal. No mastoid tenderness. Tympanic membrane is not injected, not erythematous, not retracted and not bulging. A middle ear effusion is present.  Left Ear: Hearing and external ear normal. No mastoid tenderness. Tympanic membrane is not injected, not erythematous, not retracted and not bulging. A middle ear effusion is present.  Eyes: Pupils are equal, round, and reactive to light. Conjunctivae are normal.  Neck: Normal range of motion. Neck supple.  Cardiovascular: Normal rate.  Pulmonary/Chest: Effort normal and breath sounds normal.  Musculoskeletal: Normal range of motion.  Neurological: She is alert and oriented to person, place, and time.  Skin: Skin is warm and dry.           Assessment & Plan:  Kayla Ware was seen today for acute visit.   Ear pain, bilateral information given to the patient, no gum/decrease hard foods, warm wet wash clothes, decrease stress,  can do massage, and exercise.  Very important to follow up with dentist Versus effusion Has close follow up  Pain in both hands -     Ambulatory referral to Orthopedics- wants to try to get in sooner - has seen in the past.

## 2018-05-09 NOTE — Patient Instructions (Signed)
Your ears and sinuses are connected by the eustachian tube. When your sinuses are inflamed, this can close off the tube and cause fluid to collect in your middle ear. This can then cause dizziness, popping, clicking, ringing, and echoing in your ears. This is often NOT an infection and does NOT require antibiotics, it is caused by inflammation so the treatments help the inflammation. This can take a long time to get better so please be patient.  Here are things you can do to help with this: - Try the Flonase or Nasonex. Remember to spray each nostril twice towards the outer part of your eye.  Do not sniff but instead pinch your nose and tilt your head back to help the medicine get into your sinuses.  The best time to do this is at bedtime.Stop if you get blurred vision or nose bleeds.  -While drinking fluids, pinch and hold nose close and swallow, to help open eustachian tubes to drain fluid behind ear drums. -Please pick one of the over the counter allergy medications below and take it once daily for allergies.  It will also help with fluid behind ear drums. Claritin or loratadine cheapest but likely the weakest  Zyrtec or certizine at night because it can make you sleepy The strongest is allegra or fexafinadine  Cheapest at [employer], [employer], [employer] -can use decongestant over the counter, please do not use if you have high blood pressure or certain heart conditions.   if worsening HA, changes vision/speech, imbalance, weakness go to the ER   What is the TMJ? The temporomandibular (tem-PUH-ro-man-DIB-yoo-ler) joint, or the TMJ, connects the upper and lower jawbones. This joint allows the jaw to open wide and move back and forth when you chew, talk, or yawn.There are also several muscles that help this joint move. There can be muscle tightness and pain in the muscle that can cause several symptoms.  What causes TMJ pain? There are many causes of TMJ pain. Repeated chewing (for example, chewing gum)  and clenching your teeth can cause pain in the joint. Some TMJ pain has no obvious cause. What can I do to ease the pain? There are many things you can do to help your pain get better. When you have pain:  Eat soft foods and stay away from chewy foods (for example, taffy) Try to use both sides of your mouth to chew Don't chew gum Massage Don't open your mouth wide (for example, during yawning or singing) Don't bite your cheeks or fingernails Lower your amount of stress and worry Applying a warm, damp washcloth to the joint may help. Over-the-counter pain medicines such as ibuprofen (one brand: Advil) or acetaminophen (one brand: Tylenol) might also help. Do not use these medicines if you are allergic to them or if your doctor told you not to use them. How can I stop the pain from coming back? When your pain is better, you can do these exercises to make your muscles stronger and to keep the pain from coming back:  Resisted mouth opening: Place your thumb or two fingers under your chin and open your mouth slowly, pushing up lightly on your chin with your thumb. Hold for three to six seconds. Close your mouth slowly. Resisted mouth closing: Place your thumbs under your chin and your two index fingers on the ridge between your mouth and the bottom of your chin. Push down lightly on your chin as you close your mouth. Tongue up: Slowly open and close your mouth while   keeping the tongue touching the roof of the mouth. Side-to-side jaw movement: Place an object about one fourth of an inch thick (for example, two tongue depressors) between your front teeth. Slowly move your jaw from side to side. Increase the thickness of the object as the exercise becomes easier Forward jaw movement: Place an object about one fourth of an inch thick between your front teeth and move the bottom jaw forward so that the bottom teeth are in front of the top teeth. Increase the thickness of the object as the exercise becomes  easier. These exercises should not be painful. If it hurts to do these exercises, stop doing them and talk to your family doctor.    

## 2018-05-19 NOTE — Progress Notes (Signed)
Patient ID: Kayla Ware, female   DOB: Dec 13, 1960, 57 y.o.   MRN: [id number]  Assessment and Plan:  Cholesterol: -Continue diet and exercise.  -Check cholesterol next OV  Pre-diabetes: -Continue diet and exercise.   Vitamin D Def: -check level -continue medications.   Back pain If not better will refer to [facility]- seeing for her hands No red flag symptoms  Anxiety Try lexapro Take xanax AS needed Suggest CBT counseling  Continue diet and meds as discussed. Further disposition pending results of labs. Future Appointments  Date Time Provider [place]  11/06/2018 10:00 AM Vicie Mutters, PA-C [facility] None    HPI 57 y.o. female  presents for 3 month follow up with hypertension, hyperlipidemia, prediabetes and vitamin D.  She has history of back pain. At work she will push and pull wheel chairs and does some lifting. She states she will have pains in bilateral legs x 1 month and that her hips/legs will feel "heavy" when she walks.    Her blood pressure has been controlled at home, today their BP is BP: 126/76.   She does workout. She denies chest pain, shortness of breath, dizziness.  She reports that she is still working 3 jobs and is gardening.   Patient has anxiety, had memory issues/anxiety, normal neuro workup with Dr. Jaynee Eagles. Could not tolerate celexa but did well with prozac in the past. She is on xanax take 1/4 in the AM and 1/4 at night but she has been having panic attacks.    She is on cholesterol medication and denies myalgias. Her cholesterol is at goal. The cholesterol last visit was:   Lab Results  Component Value Date   CHOL 169 11/10/2015   HDL 49 11/10/2015   LDLCALC 106 11/10/2015   TRIG 68 11/10/2015   CHOLHDL 3.4 11/10/2015    She has been working on diet and exercise for prediabetes, and denies foot ulcerations, hyperglycemia, hypoglycemia , increased appetite, nausea, paresthesia of the feet, polydipsia, polyuria, visual disturbances,  vomiting and weight loss. Last A1C in the office was:  Lab Results  Component Value Date   HGBA1C 5.6 11/10/2015   Patient is NOT on Vitamin D supplement, getting DEXA Tuesday.  Lab Results  Component Value Date   VD25OH 59 11/10/2015   BMI is Body mass index is 20.27 kg/m., she is working on diet and exercise. Wt Readings from Last 3 Encounters:  05/21/18 114 lb 6.4 oz (51.9 kg)  05/09/18 112 lb 9.6 oz (51.1 kg)  12/13/17 109 lb 9.6 oz (49.7 kg)    Current Medications:  Current Outpatient Medications on File Prior to Visit  Medication Sig Dispense Refill  . ALPRAZolam (XANAX) 1 MG tablet TAKE 1/2 TO 1 (ONE-HALF TO ONE) TABLET BY MOUTH TWICE DAILY AS NEEDED FOR ANXIETY 180 tablet 0   No current facility-administered medications on file prior to visit.     Medical History:  Past Medical History:  Diagnosis Date  . Anxiety   . Depression   . Gall stones   . Heart murmur   . Hyperlipidemia   . Unspecified vitamin D deficiency     Allergies:  Allergies  Allergen Reactions  . Citalopram Other (See Comments)    FATIGUE   . Erythromycin Nausea And Vomiting  . Meloxicam Other (See Comments)    GI UPSET  . Propofol Other (See Comments)    TINNITUS  . Codeine Nausea And Vomiting     Review of Systems:  Review of Systems  Constitutional: Negative for chills, fever and malaise/fatigue.  HENT: Negative for congestion, ear pain and sore throat.   Eyes: Negative for blurred vision and double vision.  Respiratory: Negative for cough, shortness of breath and wheezing.   Cardiovascular: Negative for chest pain, palpitations and leg swelling.  Gastrointestinal: Negative for blood in stool, constipation, diarrhea, heartburn and melena.  Genitourinary: Negative.   Musculoskeletal: Positive for back pain.  Skin: Negative.   Neurological: Negative for dizziness and loss of consciousness.  Psychiatric/Behavioral: Negative for depression. The patient is nervous/anxious. The  patient does not have insomnia.     Family history- Review and unchanged  Social history- Review and unchanged  Physical Exam: BP 126/76   Temp (!) 97.5 F (36.4 C)   Resp 16   Ht 5\' 3"  (1.6 m)   Wt 114 lb 6.4 oz (51.9 kg)   SpO2 97%   BMI 20.27 kg/m  Wt Readings from Last 3 Encounters:  05/21/18 114 lb 6.4 oz (51.9 kg)  05/09/18 112 lb 9.6 oz (51.1 kg)  12/13/17 109 lb 9.6 oz (49.7 kg)    General Appearance: Well nourished well developed, in no apparent distress. Eyes: PERRLA, EOMs, conjunctiva no swelling or erythema ENT/Mouth: Ear canals normal without obstruction, swelling, erythma, discharge.  TMs normal bilaterally.  Oropharynx moist, clear, without exudate, or postoropharyngeal swelling. Neck: Supple, thyroid normal,no cervical adenopathy  Respiratory: Respiratory effort normal, Breath sounds clear A&P without rhonchi, wheeze, or rale.  No retractions, no accessory usage. Cardio: RRR with no MRGs. Brisk peripheral pulses without edema.  Abdomen: Soft, + BS,  Non tender, no guarding, rebound, hernias, masses. Musculoskeletal: Full ROM, 5/5 strength, Normal gait Skin: Warm, dry without rashes, lesions, ecchymosis.  Neuro: Awake and oriented X 3, Cranial nerves intact. Normal muscle tone, no cerebellar symptoms. Psych: Normal affect, Insight and Judgment appropriate.    Vicie Mutters, PA-C 10:43 AM [facility]

## 2018-05-21 ENCOUNTER — Ambulatory Visit: Payer: BLUE CROSS/BLUE SHIELD | Admitting: Physician Assistant

## 2018-05-21 ENCOUNTER — Encounter: Payer: Self-pay | Admitting: Physician Assistant

## 2018-05-21 VITALS — BP 126/76 | Temp 97.5°F | Resp 16 | Ht 63.0 in | Wt 114.4 lb

## 2018-05-21 DIAGNOSIS — E785 Hyperlipidemia, unspecified: Secondary | ICD-10-CM | POA: Diagnosis not present

## 2018-05-21 DIAGNOSIS — R7303 Prediabetes: Secondary | ICD-10-CM

## 2018-05-21 DIAGNOSIS — Z79899 Other long term (current) drug therapy: Secondary | ICD-10-CM

## 2018-05-21 DIAGNOSIS — E559 Vitamin D deficiency, unspecified: Secondary | ICD-10-CM

## 2018-05-21 MED ORDER — ESCITALOPRAM OXALATE 10 MG PO TABS: 10.0000 mg | ORAL_TABLET | Freq: Every day | ORAL | 2 refills | Status: DC

## 2018-05-21 NOTE — Patient Instructions (Signed)
TUMERIC FOR INFLAMMATION  Tumeric with black pepper extract is a great natural antiinflammatory that helps with arthritis and aches and pain. Can get from [employer] or any health food store. Need to take at least 800mg  twice a day with food TO [place].   Get on the lexapro try just 10mg  1/2 pill a day for at least 2 weeks, can continue on this very low dose or go up to 10mg  a day  Continue xanax as needed  Counseling services  Here are some numbers below you can try but I suggest calling your insurance and finding out who is in your network and THEN calling those people or looking them up on google.   I'm a big fan of Cognitive Behavioral Therapy, look this up on You tube or check with the therapist you see if they are certified.  This form of therapy helps to teach you skills to better handle with current situation that are causing anxiety or depression.   BACK PAIN  Try the exercises and other information in the back care manual.   You can take meloxicam once during the day as needed (avoid taking other NSAIDS like Alleve or Ibuprofen while taking this)   You can take flexeril if needed at bedtime for muscle spasm. This can be taken up to every 8 hours, but causes sedation, so should not drive or operate heavy machinery while taking this medicine.   Go to the ER if you have any new weakness in your legs, have trouble controlling your urine or bowels, or have worsening pain.   If you are not better in 1-3 month we will refer you to ortho   Back pain Rehab Ask your health care provider which exercises are safe for you. Do exercises exactly as told by your health care provider and adjust them as directed. It is normal to feel mild stretching, pulling, tightness, or discomfort as you do these exercises, but you should stop right away if you feel sudden pain or your pain gets worse. Do not begin these exercises until told by your health care provider. Stretching and range of motion  exercises These exercises warm up your muscles and joints and improve the movement and flexibility of your hips and your back. These exercises also help to relieve pain, numbness, and tingling. Exercise A: Sciatic nerve glide 1. Sit in a chair with your head facing down toward your chest. Place your hands behind your back. Let your shoulders slump forward. 2. Slowly straighten one of your knees while you tilt your head back as if you are looking toward the ceiling. Only straighten your leg as far as you can without making your symptoms worse. 3. Hold for __________ seconds. 4. Slowly return to the starting position. 5. Repeat with your other leg. Repeat __________ times. Complete this exercise __________ times a day. Exercise B: Knee to chest with hip adduction and internal rotation  1. Lie on your back on a firm surface with both legs straight. 2. Bend one of your knees and move it up toward your chest until you feel a gentle stretch in your lower back and buttock. Then, move your knee toward the shoulder that is on the opposite side from your leg. ? Hold your leg in this position by holding onto the front of your knee. 3. Hold for __________ seconds. 4. Slowly return to the starting position. 5. Repeat with your other leg. Repeat __________ times. Complete this exercise __________ times a day.  Exercise C: Prone extension on elbows  1. Lie on your abdomen on a firm surface. A bed may be too soft for this exercise. 2. Prop yourself up on your elbows. 3. Use your arms to help lift your chest up until you feel a gentle stretch in your abdomen and your lower back. ? This will place some of your body weight on your elbows. If this is uncomfortable, try stacking pillows under your chest. ? Your hips should stay down, against the surface that you are lying on. Keep your hip and back muscles relaxed. 4. Hold for __________ seconds. 5. Slowly relax your upper body and return to the starting  position. Repeat __________ times. Complete this exercise __________ times a day. Strengthening exercises These exercises build strength and endurance in your back. Endurance is the ability to use your muscles for a long time, even after they get tired. Exercise D: Pelvic tilt 1. Lie on your back on a firm surface. Bend your knees and keep your feet flat. 2. Tense your abdominal muscles. Tip your pelvis up toward the ceiling and flatten your lower back into the floor. ? To help with this exercise, you may place a small towel under your lower back and try to push your back into the towel. 3. Hold for __________ seconds. 4. Let your muscles relax completely before you repeat this exercise. Repeat __________ times. Complete this exercise __________ times a day. Exercise E: Alternating arm and leg raises  1. Get on your hands and knees on a firm surface. If you are on a hard floor, you may want to use padding to cushion your knees, such as an exercise mat. 2. Line up your arms and legs. Your hands should be below your shoulders, and your knees should be below your hips. 3. Lift your left leg behind you. At the same time, raise your right arm and straighten it in front of you. ? Do not lift your leg higher than your hip. ? Do not lift your arm higher than your shoulder. ? Keep your abdominal and back muscles tight. ? Keep your hips facing the ground. ? Do not arch your back. ? Keep your balance carefully, and do not hold your breath. 4. Hold for __________ seconds. 5. Slowly return to the starting position and repeat with your right leg and your left arm. Repeat __________ times. Complete this exercise __________ times a day. Posture and body mechanics  Body mechanics refers to the movements and positions of your body while you do your daily activities. Posture is part of body mechanics. Good posture and healthy body mechanics can help to relieve stress in your body's tissues and joints. Good  posture means that your spine is in its natural S-curve position (your spine is neutral), your shoulders are pulled back slightly, and your head is not tipped forward. The following are general guidelines for applying improved posture and body mechanics to your everyday activities. Standing   When standing, keep your spine neutral and your feet about hip-width apart. Keep a slight bend in your knees. Your ears, shoulders, and hips should line up.  When you do a task in which you stand in one place for a long time, place one foot up on a stable object that is 2-4 inches (5-10 cm) high, such as a footstool. This helps keep your spine neutral. Sitting   When sitting, keep your spine neutral and keep your feet flat on the floor. Use a footrest, if necessary,  and keep your thighs parallel to the floor. Avoid rounding your shoulders, and avoid tilting your head forward.  When working at a desk or a computer, keep your desk at a height where your hands are slightly lower than your elbows. Slide your chair under your desk so you are close enough to maintain good posture.  When working at a computer, place your monitor at a height where you are looking straight ahead and you do not have to tilt your head forward or downward to look at the screen. Resting   When lying down and resting, avoid positions that are most painful for you.  If you have pain with activities such as sitting, bending, stooping, or squatting (flexion-based activities), lie in a position in which your body does not bend very much. For example, avoid curling up on your side with your arms and knees near your chest (fetal position).  If you have pain with activities such as standing for a long time or reaching with your arms (extension-based activities), lie with your spine in a neutral position and bend your knees slightly. Try the following positions: ? Lying on your side with a pillow between your knees. ? Lying on your back with  a pillow under your knees. Lifting   When lifting objects, keep your feet at least shoulder-width apart and tighten your abdominal muscles.  Bend your knees and hips and keep your spine neutral. It is important to lift using the strength of your legs, not your back. Do not lock your knees straight out.  Always ask for help to lift heavy or awkward objects. This information is not intended to replace advice given to you by your health care provider. Make sure you discuss any questions you have with your health care provider. Document Released: 07/03/2005 Document Revised: 03/09/2016 Document Reviewed: 03/19/2015 Elsevier Interactive Patient Education  [employer].

## 2018-09-02 ENCOUNTER — Other Ambulatory Visit: Payer: Self-pay | Admitting: Physician Assistant

## 2018-10-26 ENCOUNTER — Other Ambulatory Visit: Payer: Self-pay | Admitting: Physician Assistant

## 2018-10-26 DIAGNOSIS — F419 Anxiety disorder, unspecified: Secondary | ICD-10-CM

## 2018-11-06 ENCOUNTER — Encounter: Payer: Self-pay | Admitting: Physician Assistant

## 2019-04-08 ENCOUNTER — Other Ambulatory Visit: Payer: Self-pay | Admitting: Physician Assistant

## 2019-04-08 DIAGNOSIS — F419 Anxiety disorder, unspecified: Secondary | ICD-10-CM

## 2022-07-24 DIAGNOSIS — R682 Dry mouth, unspecified: Secondary | ICD-10-CM | POA: Diagnosis not present

## 2022-08-21 DIAGNOSIS — F419 Anxiety disorder, unspecified: Secondary | ICD-10-CM | POA: Diagnosis not present

## 2022-08-21 DIAGNOSIS — J019 Acute sinusitis, unspecified: Secondary | ICD-10-CM | POA: Diagnosis not present

## 2022-08-21 DIAGNOSIS — F330 Major depressive disorder, recurrent, mild: Secondary | ICD-10-CM | POA: Diagnosis not present

## 2022-08-21 DIAGNOSIS — D692 Other nonthrombocytopenic purpura: Secondary | ICD-10-CM | POA: Diagnosis not present

## 2022-09-13 DIAGNOSIS — J069 Acute upper respiratory infection, unspecified: Secondary | ICD-10-CM | POA: Diagnosis not present

## 2023-01-03 DIAGNOSIS — E559 Vitamin D deficiency, unspecified: Secondary | ICD-10-CM | POA: Diagnosis not present

## 2023-01-03 DIAGNOSIS — R7989 Other specified abnormal findings of blood chemistry: Secondary | ICD-10-CM | POA: Diagnosis not present

## 2023-01-22 DIAGNOSIS — H938X3 Other specified disorders of ear, bilateral: Secondary | ICD-10-CM | POA: Diagnosis not present

## 2023-01-24 DIAGNOSIS — M79641 Pain in right hand: Secondary | ICD-10-CM | POA: Diagnosis not present

## 2023-01-24 DIAGNOSIS — M654 Radial styloid tenosynovitis [de Quervain]: Secondary | ICD-10-CM | POA: Diagnosis not present

## 2023-04-04 DIAGNOSIS — H43813 Vitreous degeneration, bilateral: Secondary | ICD-10-CM | POA: Diagnosis not present

## 2023-04-04 DIAGNOSIS — H1045 Other chronic allergic conjunctivitis: Secondary | ICD-10-CM | POA: Diagnosis not present

## 2023-04-04 DIAGNOSIS — H0102A Squamous blepharitis right eye, upper and lower eyelids: Secondary | ICD-10-CM | POA: Diagnosis not present

## 2023-04-04 DIAGNOSIS — H0102B Squamous blepharitis left eye, upper and lower eyelids: Secondary | ICD-10-CM | POA: Diagnosis not present

## 2023-04-04 DIAGNOSIS — H04123 Dry eye syndrome of bilateral lacrimal glands: Secondary | ICD-10-CM | POA: Diagnosis not present

## 2023-04-04 DIAGNOSIS — H2513 Age-related nuclear cataract, bilateral: Secondary | ICD-10-CM | POA: Diagnosis not present

## 2023-04-04 DIAGNOSIS — H02052 Trichiasis without entropian right lower eyelid: Secondary | ICD-10-CM | POA: Diagnosis not present

## 2023-05-21 DIAGNOSIS — R895 Abnormal microbiological findings in specimens from other organs, systems and tissues: Secondary | ICD-10-CM | POA: Diagnosis not present

## 2023-05-21 DIAGNOSIS — Z01419 Encounter for gynecological examination (general) (routine) without abnormal findings: Secondary | ICD-10-CM | POA: Diagnosis not present

## 2023-05-21 DIAGNOSIS — Z6821 Body mass index (BMI) 21.0-21.9, adult: Secondary | ICD-10-CM | POA: Diagnosis not present

## 2023-05-21 DIAGNOSIS — F418 Other specified anxiety disorders: Secondary | ICD-10-CM | POA: Diagnosis not present

## 2023-08-31 DIAGNOSIS — Z01419 Encounter for gynecological examination (general) (routine) without abnormal findings: Secondary | ICD-10-CM | POA: Diagnosis not present

## 2023-08-31 DIAGNOSIS — Z13820 Encounter for screening for osteoporosis: Secondary | ICD-10-CM | POA: Diagnosis not present

## 2023-08-31 DIAGNOSIS — Z1231 Encounter for screening mammogram for malignant neoplasm of breast: Secondary | ICD-10-CM | POA: Diagnosis not present

## 2023-08-31 DIAGNOSIS — K149 Disease of tongue, unspecified: Secondary | ICD-10-CM | POA: Diagnosis not present

## 2023-09-17 DIAGNOSIS — M25632 Stiffness of left wrist, not elsewhere classified: Secondary | ICD-10-CM | POA: Diagnosis not present

## 2023-10-15 DIAGNOSIS — K116 Mucocele of salivary gland: Secondary | ICD-10-CM | POA: Diagnosis not present

## 2023-10-15 DIAGNOSIS — R682 Dry mouth, unspecified: Secondary | ICD-10-CM | POA: Diagnosis not present

## 2023-11-21 DIAGNOSIS — R0989 Other specified symptoms and signs involving the circulatory and respiratory systems: Secondary | ICD-10-CM | POA: Diagnosis not present

## 2024-01-08 DIAGNOSIS — L03115 Cellulitis of right lower limb: Secondary | ICD-10-CM | POA: Diagnosis not present

## 2024-01-15 DIAGNOSIS — L03115 Cellulitis of right lower limb: Secondary | ICD-10-CM | POA: Diagnosis not present

## 2024-04-14 DIAGNOSIS — H02052 Trichiasis without entropian right lower eyelid: Secondary | ICD-10-CM | POA: Diagnosis not present

## 2024-04-14 DIAGNOSIS — H0102B Squamous blepharitis left eye, upper and lower eyelids: Secondary | ICD-10-CM | POA: Diagnosis not present

## 2024-04-14 DIAGNOSIS — H04123 Dry eye syndrome of bilateral lacrimal glands: Secondary | ICD-10-CM | POA: Diagnosis not present

## 2024-04-14 DIAGNOSIS — H43813 Vitreous degeneration, bilateral: Secondary | ICD-10-CM | POA: Diagnosis not present

## 2024-04-14 DIAGNOSIS — H1045 Other chronic allergic conjunctivitis: Secondary | ICD-10-CM | POA: Diagnosis not present

## 2024-04-14 DIAGNOSIS — H0102A Squamous blepharitis right eye, upper and lower eyelids: Secondary | ICD-10-CM | POA: Diagnosis not present

## 2024-07-06 ENCOUNTER — Other Ambulatory Visit: Payer: Self-pay

## 2024-07-06 ENCOUNTER — Encounter (HOSPITAL_BASED_OUTPATIENT_CLINIC_OR_DEPARTMENT_OTHER): Payer: Self-pay

## 2024-07-06 ENCOUNTER — Observation Stay (HOSPITAL_BASED_OUTPATIENT_CLINIC_OR_DEPARTMENT_OTHER)
Admission: EM | Admit: 2024-07-06 | Discharge: 2024-07-08 | Disposition: A | Discharge status: Home / Self Care | Attending: Internal Medicine | Admitting: Internal Medicine

## 2024-07-06 ENCOUNTER — Emergency Department (HOSPITAL_BASED_OUTPATIENT_CLINIC_OR_DEPARTMENT_OTHER)

## 2024-07-06 DIAGNOSIS — R0789 Other chest pain: Secondary | ICD-10-CM | POA: Diagnosis not present

## 2024-07-06 DIAGNOSIS — Z7982 Long term (current) use of aspirin: Secondary | ICD-10-CM | POA: Insufficient documentation

## 2024-07-06 DIAGNOSIS — R202 Paresthesia of skin: Secondary | ICD-10-CM | POA: Diagnosis not present

## 2024-07-06 DIAGNOSIS — G459 Transient cerebral ischemic attack, unspecified: Secondary | ICD-10-CM | POA: Diagnosis not present

## 2024-07-06 DIAGNOSIS — Z72820 Sleep deprivation: Secondary | ICD-10-CM | POA: Diagnosis not present

## 2024-07-06 DIAGNOSIS — Z79899 Other long term (current) drug therapy: Secondary | ICD-10-CM | POA: Insufficient documentation

## 2024-07-06 DIAGNOSIS — I959 Hypotension, unspecified: Secondary | ICD-10-CM | POA: Insufficient documentation

## 2024-07-06 DIAGNOSIS — R2990 Unspecified symptoms and signs involving the nervous system: Secondary | ICD-10-CM | POA: Diagnosis present

## 2024-07-06 DIAGNOSIS — R200 Anesthesia of skin: Secondary | ICD-10-CM | POA: Diagnosis present

## 2024-07-06 DIAGNOSIS — F32A Depression, unspecified: Secondary | ICD-10-CM | POA: Insufficient documentation

## 2024-07-06 DIAGNOSIS — R42 Dizziness and giddiness: Secondary | ICD-10-CM | POA: Diagnosis present

## 2024-07-06 DIAGNOSIS — E860 Dehydration: Secondary | ICD-10-CM | POA: Insufficient documentation

## 2024-07-06 DIAGNOSIS — Z743 Need for continuous supervision: Secondary | ICD-10-CM | POA: Diagnosis not present

## 2024-07-06 DIAGNOSIS — R079 Chest pain, unspecified: Secondary | ICD-10-CM | POA: Diagnosis not present

## 2024-07-06 DIAGNOSIS — F419 Anxiety disorder, unspecified: Secondary | ICD-10-CM

## 2024-07-06 DIAGNOSIS — E785 Hyperlipidemia, unspecified: Secondary | ICD-10-CM | POA: Diagnosis not present

## 2024-07-06 LAB — APTT: aPTT: 31 s (ref 24–36)

## 2024-07-06 LAB — COMPREHENSIVE METABOLIC PANEL WITH GFR
ALT: 12 U/L (ref 0–44)
AST: 30 U/L (ref 15–41)
Albumin: 4.3 g/dL (ref 3.5–5.0)
Alkaline Phosphatase: 105 U/L (ref 38–126)
Anion gap: 13 (ref 5–15)
BUN: 8 mg/dL (ref 8–23)
CO2: 25 mmol/L (ref 22–32)
Calcium: 9 mg/dL (ref 8.9–10.3)
Chloride: 105 mmol/L (ref 98–111)
Creatinine, Ser: 0.77 mg/dL (ref 0.44–1.00)
GFR, Estimated: >60 mL/min
Glucose, Bld: 110 mg/dL — ABNORMAL HIGH (ref 70–99)
Potassium: 3.7 mmol/L (ref 3.5–5.1)
Sodium: 143 mmol/L (ref 135–145)
Total Bilirubin: 0.5 mg/dL (ref 0.0–1.2)
Total Protein: 7.3 g/dL (ref 6.5–8.1)

## 2024-07-06 LAB — CBC
HCT: 42.1 % (ref 36.0–46.0)
Hemoglobin: 13.9 g/dL (ref 12.0–15.0)
MCH: 31.1 pg (ref 26.0–34.0)
MCHC: 33 g/dL (ref 30.0–36.0)
MCV: 94.2 fL (ref 80.0–100.0)
Platelets: 237 K/uL (ref 150–400)
RBC: 4.47 MIL/uL (ref 3.87–5.11)
RDW: 12.9 % (ref 11.5–15.5)
WBC: 5.8 K/uL (ref 4.0–10.5)
nRBC: 0 % (ref 0.0–0.2)

## 2024-07-06 LAB — DIFFERENTIAL
Abs Immature Granulocytes: 0.03 K/uL (ref 0.00–0.07)
Basophils Absolute: 0 K/uL (ref 0.0–0.1)
Basophils Relative: 1 %
Eosinophils Absolute: 0.1 K/uL (ref 0.0–0.5)
Eosinophils Relative: 1 %
Immature Granulocytes: 1 %
Lymphocytes Relative: 36 %
Lymphs Abs: 2.1 K/uL (ref 0.7–4.0)
Monocytes Absolute: 0.5 K/uL (ref 0.1–1.0)
Monocytes Relative: 8 %
Neutro Abs: 3.1 K/uL (ref 1.7–7.7)
Neutrophils Relative %: 53 %

## 2024-07-06 LAB — TROPONIN T, HIGH SENSITIVITY
Troponin T High Sensitivity: <15 ng/L (ref 0–19)
Troponin T High Sensitivity: <15 ng/L (ref 0–19)

## 2024-07-06 LAB — PROTIME-INR
INR: 0.9 (ref 0.8–1.2)
Prothrombin Time: 12.8 s (ref 11.4–15.2)

## 2024-07-06 LAB — ETHANOL: Alcohol, Ethyl (B): <15 mg/dL

## 2024-07-06 LAB — CBG MONITORING, ED: Glucose-Capillary: 121 mg/dL — ABNORMAL HIGH (ref 70–99)

## 2024-07-06 MED ORDER — ESCITALOPRAM OXALATE 10 MG PO TABS
10.0000 mg | ORAL_TABLET | Freq: Every day | ORAL | Status: DC
  Administered 2024-07-07: 10 mg via ORAL
  Filled 2024-07-06: qty 1

## 2024-07-06 MED ORDER — STROKE: EARLY STAGES OF RECOVERY BOOK
Freq: Once | Status: DC
  Filled 2024-07-06: qty 1

## 2024-07-06 MED ORDER — IOHEXOL 350 MG/ML SOLN
75.0000 mL | Freq: Once | INTRAVENOUS | Status: AC | PRN
  Administered 2024-07-06: 75 mL via INTRAVENOUS

## 2024-07-06 MED ORDER — LORAZEPAM 2 MG/ML IJ SOLN
1.0000 mg | Freq: Once | INTRAMUSCULAR | Status: AC
  Administered 2024-07-06: 1 mg via INTRAVENOUS
  Filled 2024-07-06: qty 1

## 2024-07-06 MED ORDER — ACETAMINOPHEN 160 MG/5ML PO SOLN: 650.0000 mg | ORAL | Status: DC | PRN

## 2024-07-06 MED ORDER — ACETAMINOPHEN 325 MG PO TABS
650.0000 mg | ORAL_TABLET | ORAL | Status: DC | PRN
  Administered 2024-07-06: 650 mg via ORAL
  Filled 2024-07-06: qty 2

## 2024-07-06 MED ORDER — SENNOSIDES-DOCUSATE SODIUM 8.6-50 MG PO TABS: 1.0000 | ORAL_TABLET | Freq: Every evening | ORAL | Status: DC | PRN

## 2024-07-06 MED ORDER — ACETAMINOPHEN 650 MG RE SUPP: 650.0000 mg | RECTAL | Status: DC | PRN

## 2024-07-06 MED ORDER — ALPRAZOLAM 0.5 MG PO TABS: 0.5000 mg | ORAL_TABLET | Freq: Two times a day (BID) | ORAL | Status: DC | PRN

## 2024-07-06 NOTE — ED Notes (Signed)
 Patient transported to MRI

## 2024-07-06 NOTE — ED Notes (Signed)
 MD verbal order not to activate code stroke.

## 2024-07-06 NOTE — Consult Note (Signed)
 NEUROLOGY CONSULT NOTE   Date of service: July 06, 2024 Patient Name: Kayla Ware MRN:  [id number] DOB:  01/19/1961 Chief Complaint: L sided numbness and tingling Requesting Provider: Sherre Greig SAILOR, DO  History of Present Illness  Kayla Ware is a 63 y.o. female with hx of anxiety, depression, HLD, Vit D deficiency who presents wo the    has a past medical history of Anxiety, Depression, Gall stones, Heart murmur, Hyperlipidemia, and Unspecified vitamin D  deficiency.   LKW: *** Modified rankin score: {Modified Rankin Scale:21264} IV Thrombolysis: ***Yes, *** No (reason) EVT: ***Yes, *** No (reason) ICH Score:***  NIHSS components Score: Comment  1a Level of Conscious 0[]  1[]  2[]  3[]      1b LOC Questions 0[]  1[]  2[]       1c LOC Commands 0[]  1[]  2[]       2 Best Gaze 0[]  1[]  2[]       3 Visual 0[]  1[]  2[]  3[]      4 Facial Palsy 0[]  1[]  2[]  3[]      5a Motor Arm - left 0[]  1[]  2[]  3[]  4[]  UN[]    5b Motor Arm - Right 0[]  1[]  2[]  3[]  4[]  UN[]    6a Motor Leg - Left 0[]  1[]  2[]  3[]  4[]  UN[]    6b Motor Leg - Right 0[]  1[]  2[]  3[]  4[]  UN[]    7 Limb Ataxia 0[]  1[]  2[]  UN[]      8 Sensory 0[]  1[]  2[]  UN[]      9 Best Language 0[]  1[]  2[]  3[]      10 Dysarthria 0[]  1[]  2[]  UN[]      11 Extinct. and Inattention 0[]  1[]  2[]       TOTAL:       ROS  ***Comprehensive ROS performed and pertinent positives documented in HPI  ***Unable to ascertain due to ***  Past History   Past Medical History:  Diagnosis Date   Anxiety    Depression    Gall stones    Heart murmur    Hyperlipidemia    Unspecified vitamin D  deficiency     Past Surgical History:  Procedure Laterality Date   bo surgical history     none      Family History: Family History  Problem Relation Age of Onset   Colon polyps Father    Hyperlipidemia Father    Depression Father    Colon cancer Paternal Aunt 3   Arthritis Brother        RHEUMATOID   Cancer Maternal Aunt        breast, cervical    Cancer Maternal Grandmother    Birth defects Maternal Grandmother        breast   Diabetes Paternal Grandmother    Dementia Neg Hx     Social History  reports that she has never smoked. She has never used smokeless tobacco. She reports current alcohol use of about 6.0 standard drinks of alcohol per week. She reports that she does not use drugs.  Allergies[1]  Medications  Current Medications[2]  Vitals   Vitals:   07/06/24 1430 07/06/24 1615 07/06/24 1800 07/06/24 2000  BP: 98/77 116/88 136/73 (!) 111/58  Pulse: 83 89 67 63  Resp: 18 20 19 20   Temp: 98.6 F (37 C) 98.6 F (37 C) 98.6 F (37 C) 98.7 F (37.1 C)  TempSrc: Oral Oral Oral Oral  SpO2: 99% 99% 99% 99%  Weight:      Height:        Body mass index is 22.66 kg/m.  Physical Exam   Constitutional: Appears well-developed and well-nourished. *** Psych: Affect appropriate to situation. *** Eyes: No scleral injection. *** HENT: No OP obstruction. *** Head: Normocephalic. *** Cardiovascular: Normal rate and regular rhythm. *** Respiratory: Effort normal, non-labored breathing. *** GI: Soft.  No distension. There is no tenderness. *** Skin: WDI. ***  Neurologic Examination   ***  Labs/Imaging/Neurodiagnostic studies   CBC:  Recent Labs  Lab 07-24-24 0956  WBC 5.8  NEUTROABS 3.1  HGB 13.9  HCT 42.1  MCV 94.2  PLT 237   Basic Metabolic Panel:  Lab Results  Component Value Date   NA 143 07/24/2024   K 3.7 07-24-2024   CO2 25 July 24, 2024   GLUCOSE 110 (H) Jul 24, 2024   BUN 8 07-24-24   CREATININE 0.77 24-Jul-2024   CALCIUM  9.0 2024/07/24   GFRNONAA >60 07-24-24   GFRAA 112 10/08/2017   Lipid Panel:  Lab Results  Component Value Date   LDLCALC 106 11/10/2015   HgbA1c:  Lab Results  Component Value Date   HGBA1C 5.6 11/10/2015   Urine Drug Screen: No results found for: LABOPIA, COCAINSCRNUR, LABBENZ, AMPHETMU, THCU, LABBARB  Alcohol Level     Component Value Date/Time    [facility] <15 2024-07-24 0956   INR  Lab Results  Component Value Date   INR 0.9 July 24, 2024   APTT  Lab Results  Component Value Date   APTT 31 24-Jul-2024   AED levels: No results found for: PHENYTOIN, ZONISAMIDE, LAMOTRIGINE, LEVETIRACETA  CT Head without contrast(Personally reviewed): ***  CT angio Head and Neck with contrast(Personally reviewed): ***  MR Angio head without contrast and Carotid Duplex BL(Personally reviewed): ***  MRI Brain(Personally reviewed): ***  Neurodiagnostics rEEG:  ***  ASSESSMENT   Kayla Ware is a 63 y.o. female ***  RECOMMENDATIONS  *** ______________________________________________________________________    Signed, Ellouise Mari, MD [facility]     [1]  Allergies Allergen Reactions   Citalopram Other (See Comments)    FATIGUE    Erythromycin Nausea And Vomiting   Meloxicam  Other (See Comments)    GI UPSET   Propofol Other (See Comments)    TINNITUS   Codeine Nausea And Vomiting  [2]  Current Facility-Administered Medications:     stroke: early stages of recovery book, , Does not apply, Once, Cox, Amy N, DO   acetaminophen  (TYLENOL ) tablet 650 mg, 650 mg, Oral, Q4H PRN, 650 mg at Jul 24, 2024 2036 **OR** acetaminophen  (TYLENOL ) 160 MG/5ML solution 650 mg, 650 mg, Per Tube, Q4H PRN **OR** acetaminophen  (TYLENOL ) suppository 650 mg, 650 mg, Rectal, Q4H PRN, Cox, Amy N, DO   ALPRAZolam  (XANAX ) tablet 0.5 mg, 0.5 mg, Oral, BID PRN, Cox, Amy N, DO   [START ON 07/07/2024] escitalopram  (LEXAPRO ) tablet 10 mg, 10 mg, Oral, Daily, Cox, Amy N, DO   senna-docusate (Senokot-S) tablet 1 tablet, 1 tablet, Oral, QHS PRN, Cox, Amy N, DO  Current Outpatient Medications:    ALPRAZolam  (XANAX ) 1 MG tablet, TAKE 1/2 TO 1 (ONE-HALF TO ONE) TABLET BY MOUTH TWICE DAILY AS NEEDED FOR ANXIETY, Disp: 180 tablet, Rfl: 0   escitalopram  (LEXAPRO ) 10 MG tablet, Take 1 tablet by mouth once daily, Disp: 30 tablet, Rfl: 0

## 2024-07-06 NOTE — Plan of Care (Signed)

## 2024-07-06 NOTE — ED Notes (Signed)
 Patient transported to CT

## 2024-07-06 NOTE — Assessment & Plan Note (Addendum)
 Stroke-like symptoms Neurology needs to be consulted upon arrival to [facility] bed Complete echo Fasting lipid and A1c ordered Frequent neuro vascular checks Per nursing, patient has passed her swallow screen Heart healthy diet ordered PT, OT Fall precaution

## 2024-07-06 NOTE — Assessment & Plan Note (Signed)
 Patient currently not on cholesterol medication Check lipid panel in the a.m.

## 2024-07-06 NOTE — ED Triage Notes (Addendum)
 Dizziness, L sided facial numbness and L hand numbness since 0730. No obvious facial droop or slurred speech. LKW 2000 last night  A&Ox4, ambulatory EDP in triage

## 2024-07-06 NOTE — Assessment & Plan Note (Signed)
 Unclear etiology at this time However in setting of patient developing symptoms with activity,, while she is at work during hair, unstable angina cannot be excluded at this time Check hs troponin Complete echo ordered A.m. team to consider consultation to cardiology for inpatient stress testing

## 2024-07-06 NOTE — H&P (Addendum)
 " History and Physical - Telemedicine  KANDI BRUSSEAU FMW:[id number] DOB: 05/04/1961 DOA: 07/06/2024  PCP: Tonita Fallow, MD (Inactive) Patient coming from: home  Referring provider: Dr. Lenor, EDP Telemedicine provider: Dr. Sherre Patient location: [facility] emergency department at [facility] Referring diagnosis: TIA Patient name and DOB verified: Patient was able to verify her first and last name: Kayla Ware, date of birth: 1960-09-03. Patient consented to Telemedicine Evaluation: Yes RN virtual assistant: Suzen Land, RN Video encounter time and date: 07/06/2024 and at approximately: 14:08  Chief Concern: Dizziness, right-sided facial numbness  HPI: Kayla Ware is a 63 year old female with history of anxiety, depression.  12/21: She presents to the ED for chief concerns of dizziness and left-sided facial numbness.  Vitals in the ED showed temperature of 98.6, respiration rate of 13, heart rate 81, blood pressure 117/75, SpO2 100% on room air.  Serum sodium 143, potassium 3.7, chloride 105, bicarb 25, BUN of 8, serum creatinine 0.77, eGFR greater than 60, nonfasting blood glucose 110, WBC 5.8, hemoglobin 13.9, platelets of 237.  CTA head and neck: Was read as no acute intracranial hemorrhage or evidence of acute ischemic change.   MRI brain wo cm: No acute intracranial abnormality.  ED treatment: None.  12/21: Patient admitted to hospitalist service for chief concerns of TIA. ------------------------ At bedside, patient was able to confirm her first left name, her date of birth.  Patient reports that she woke up a little later than normal today at 7:30 AM.  She got up however felt dizzy and this lasted about 10 seconds.  She then went about taking her dog out and then went back to sleep.  At around 9 AM, she woke up was going about her day when she developed left-sided tongue numbness and left-sided facial numbness and tingling.  She then noticed all  the fingers in her left hand 1 at a time becoming numb and tingling.  The entire episode lasted about 20 minutes.  Currently it has resolved.  She denies recent trauma to her person, shortness of breath, cough, fever, chills, history of CVA that she knows of, history of heart attacks.  Of note she gets chest pain occasionally.  She reports that the pain lasts a few minutes and then go away.  Initially she had difficulty telling me when the chest pain comes on however the last time she experienced chest pain was 3 days ago at work.  She works at a [employer].  She was standing doing here when she developed chest pain that is dull.  This lasted a few minutes.  She stood still and stopped the activities she was working on.  The pain then went away and she resumed working.  Social history: Patient denies tobacco and recreational drug use.  She reports she has 4 cocktails (each cocktail has 1 ounce of vodka), per week.  Patient denies IV recreational drug use.  Patient works as a interior and spatial designer.  ROS: Constitutional: no weight change, no fever ENT/Mouth: no sore throat, no rhinorrhea Eyes: no eye pain, no vision changes Cardiovascular: + chest pain, no dyspnea,  no edema, no palpitations Respiratory: no cough, no sputum, no wheezing Gastrointestinal: no nausea, no vomiting, no diarrhea, no constipation Genitourinary: no urinary incontinence, no dysuria, no hematuria Musculoskeletal: no arthralgias, no myalgias Skin: no skin lesions, no pruritus, Neuro: no weakness, no loss of consciousness, no syncope, + numbness and tingling in the left side of her body Psych: no anxiety, no depression, no decrease  appetite Heme/Lymph: no bruising, no bleeding  ED Course: Discussed with EDP, patient requiring hospitalization for strokelike symptoms/TIA.  Assessment/Plan  Principal Problem:   Stroke-like symptom Active Problems:   Chest pain   Anxiety   Hyperlipidemia   Assessment and Plan:  *  Stroke-like symptom Stroke-like symptoms Neurology needs to be consulted upon arrival to [facility] bed Complete echo Fasting lipid and A1c ordered Frequent neuro vascular checks Per nursing, patient has passed her swallow screen Heart healthy diet ordered PT, OT Fall precaution  Chest pain Unclear etiology at this time However in setting of patient developing symptoms with activity,, while she is at work during hair, unstable angina cannot be excluded at this time Check hs troponin Complete echo ordered A.m. team to consider consultation to cardiology for inpatient stress testing  Anxiety Home escitalopram  10 mg daily Alprazolam  0.5 mg as needed for anxiety, sleep resumed PDMP reviewed  Hyperlipidemia Patient currently not on cholesterol medication Check lipid panel in the a.m.  Chart reviewed.   DVT prophylaxis: Pharmacologic DVT not initiated on admission.  AM team to initiate pharmacologic DVT when the benefits outweigh the risk. Code Status: Full code Diet: Heart healthy Family Communication: A phone call was offered, patient declined Disposition Plan: Pending clinical course Consults called: Patient will need neurology consultation upon arrival to medical floor, at [facility], Admission status: Observation, telemetry  Past Medical History:  Diagnosis Date   Anxiety    Depression    Gall stones    Heart murmur    Hyperlipidemia    Unspecified vitamin D  deficiency    Past Surgical History:  Procedure Laterality Date   bo surgical history     none     Social History:  reports that she has never smoked. She has never used smokeless tobacco. She reports current alcohol use of about 6.0 standard drinks of alcohol per week. She reports that she does not use drugs.  Allergies[1] Family History  Problem Relation Age of Onset   Colon polyps Father    Hyperlipidemia Father    Depression Father    Colon cancer Paternal Aunt 75   Arthritis Brother         RHEUMATOID   Cancer Maternal Aunt        breast, cervical   Cancer Maternal Grandmother    Birth defects Maternal Grandmother        breast   Diabetes Paternal Grandmother    Dementia Neg Hx    Family history: Family history reviewed and not pertinent.  Prior to Admission medications  Medication Sig Start Date End Date Taking? Authorizing Provider  ALPRAZolam  (XANAX ) 1 MG tablet TAKE 1/2 TO 1 (ONE-HALF TO ONE) TABLET BY MOUTH TWICE DAILY AS NEEDED FOR ANXIETY 10/26/18   Collier, Amanda R, PA-C  escitalopram  (LEXAPRO ) 10 MG tablet Take 1 tablet by mouth once daily 10/26/18   Craig Alan SAUNDERS, PA-C   Physical Exam completed with assistance of: Suzen Land, RN, who was at bedside during this portion of the virtual encounter:  Vitals:   07/06/24 1314 07/06/24 1315 07/06/24 1430 07/06/24 1615  BP:  107/65 98/77 116/88  Pulse:  80 83 89  Resp:  14 18 20   Temp:  98.6 F (37 C) 98.6 F (37 C) 98.6 F (37 C)  TempSrc:  Oral Oral Oral  SpO2:  99% 99% 99%  Weight: 56.2 kg     Height: 5' 2 (1.575 m)      Constitutional: appears age-appropriate, NAD, calm  Eyes: EOMI,  conjunctivae normal ENMT: Mucous membranes are moist. Hearing appropriate Neck: normal, supple, no masses, no thyromegaly Respiratory: clear to auscultation bilaterally, no wheezing. Normal respiratory effort. No accessory muscle use.  Cardiovascular: Regular rate and rhythm, no murmurs. No extremity edema. 2+ pedal pulses. Abdomen: no tenderness. Bowel sounds positive.  Musculoskeletal: No joint deformity upper and lower extremities. Good ROM, no contractures, no atrophy. Skin: no rashes, ulcers on visible skin Neurologic: Strength is appropriate upper extremities.  Psychiatric: Normal judgment and insight. Alert and oriented x 3. Normal mood.   EKG: independently reviewed, showing sinus rhythm with rate of 82, QTc 454  Chest x-ray on Admission: I personally reviewed and I agree with radiologist reading as  below.  MR BRAIN WO CONTRAST Result Date: 07/06/2024 EXAM: MRI BRAIN WITHOUT CONTRAST 07/06/2024 12:40:00 PM TECHNIQUE: Multiplanar multisequence MRI of the head/brain was performed without the administration of intravenous contrast. Study is mildly degraded by patient motion. COMPARISON: None available. CLINICAL HISTORY: Neuro deficit, acute, stroke suspected. Dizziness. Left-sided facial numbness and left hand numbness beginning at 07:30 am today. FINDINGS: BRAIN AND VENTRICLES: No acute infarct. No intracranial hemorrhage. No mass. No midline shift. No hydrocephalus. The sella is unremarkable. Normal flow voids. ORBITS: No acute abnormality. SINUSES AND MASTOIDS: No acute abnormality. BONES AND SOFT TISSUES: Normal marrow signal. No acute soft tissue abnormality. IMPRESSION: 1. No acute intracranial abnormality. 2. Study is mildly degraded by patient motion. Electronically signed by: Lonni Necessary MD 07/06/2024 01:10 PM EST RP Workstation: [id number]   CT Angio Head Neck W WO CM Result Date: 07/06/2024 EXAM: CTA Head and Neck with Intravenous Contrast. CT Head without Contrast. CLINICAL HISTORY: Neuro deficit, acute, stroke suspected. TECHNIQUE: Axial CTA images of the head and neck performed with intravenous contrast. MIP reconstructed images were created and reviewed. Axial computed tomography images of the head/brain performed without intravenous contrast. Note: Per PQRS, the description of internal carotid artery percent stenosis, including 0 percent or normal exam, is based on [employer] American Symptomatic Carotid Endarterectomy Trial (NASCET) criteria. Dose reduction technique was used including one or more of the following: automated exposure control, adjustment of mA and kV according to patient size, and/or iterative reconstruction. CONTRAST: With; 75mL iohexol  (OMNIPAQUE ) 350 MG/ML injection. COMPARISON: CT of the head dated 07/06/2024. FINDINGS: CT HEAD: BRAIN: No acute intraparenchymal  hemorrhage. No mass lesion. No CT evidence for acute territorial infarct. No midline shift or extra-axial collection. VENTRICLES: No hydrocephalus. ORBITS: The orbits are unremarkable. SINUSES AND MASTOIDS: The paranasal sinuses and mastoid air cells are clear. CTA NECK: COMMON CAROTID ARTERIES: No significant stenosis. No dissection or occlusion. INTERNAL CAROTID ARTERIES: No stenosis by NASCET criteria. No dissection or occlusion. VERTEBRAL ARTERIES: No significant stenosis. No dissection or occlusion. CTA HEAD: ANTERIOR CEREBRAL ARTERIES: No significant stenosis. No occlusion. No aneurysm. MIDDLE CEREBRAL ARTERIES: No significant stenosis. No occlusion. No aneurysm. POSTERIOR CEREBRAL ARTERIES: There is fetal type origin of the right posterior cerebral artery. No significant stenosis. No occlusion. No aneurysm. BASILAR ARTERY: No significant stenosis. No occlusion. No aneurysm. OTHER: SOFT TISSUES: No acute finding. No masses or lymphadenopathy. BONES: No acute osseous abnormality. IMPRESSION: 1. No acute intracranial hemorrhage or evidence of acute ischemic change. 2. No evidence of significant stenosis, aneurysmal dilatation, or dissection involving the arteries of the head and neck. 3. Fetal type origin of the right posterior cerebral artery. Electronically signed by: Evalene Coho MD 07/06/2024 11:52 AM EST RP Workstation: [id number]   CT HEAD WO CONTRAST Result Date: 07/06/2024 EXAM: CT  HEAD WITHOUT CONTRAST 07/06/2024 10:06:00 AM TECHNIQUE: CT of the head was performed without the administration of intravenous contrast. Automated exposure control, iterative reconstruction, and/or weight based adjustment of the mA/kV was utilized to reduce the radiation dose to as low as reasonably achievable. COMPARISON: CTA head/neck February 12, 2017 CLINICAL HISTORY: Neuro deficit, acute, stroke suspected FINDINGS: BRAIN AND VENTRICLES: No acute hemorrhage. No evidence of acute infarct. No hydrocephalus. No  extra-axial collection. No mass effect or midline shift. ORBITS: No acute abnormality. SINUSES: No acute abnormality. SOFT TISSUES AND SKULL: No acute soft tissue abnormality. No skull fracture. IMPRESSION: 1. No acute intracranial abnormality. Electronically signed by: Gilmore Molt 07/06/2024 10:15 AM EST RP Workstation: [id number]   Labs on Admission: I have personally reviewed following labs  CBC: Recent Labs  Lab 07/06/24 0956  WBC 5.8  NEUTROABS 3.1  HGB 13.9  HCT 42.1  MCV 94.2  PLT 237   Basic Metabolic Panel: Recent Labs  Lab 07/06/24 0956  NA 143  K 3.7  CL 105  CO2 25  GLUCOSE 110*  BUN 8  CREATININE 0.77  CALCIUM  9.0   GFR: Estimated Creatinine Clearance: 56.2 mL/min (by C-G formula based on SCr of 0.77 mg/dL).  Liver Function Tests: Recent Labs  Lab 07/06/24 0956  AST 30  ALT 12  ALKPHOS 105  BILITOT 0.5  PROT 7.3  ALBUMIN 4.3   No results for input(s): LIPASE, AMYLASE in the last 168 hours. No results for input(s): AMMONIA in the last 168 hours. Coagulation Profile: Recent Labs  Lab 07/06/24 0945  INR 0.9   CBG: Recent Labs  Lab 07/06/24 0943  GLUCAP 121*   Urine analysis:    Component Value Date/Time   COLORURINE YELLOW 10/08/2017 1206   APPEARANCEUR CLEAR 10/08/2017 1206   LABSPEC 1.012 10/08/2017 1206   PHURINE 5.5 10/08/2017 1206   GLUCOSEU NEGATIVE 10/08/2017 1206   HGBUR NEGATIVE 10/08/2017 1206   BILIRUBINUR NEGATIVE 02/22/2017 1718   KETONESUR NEGATIVE 10/08/2017 1206   PROTEINUR NEGATIVE 10/08/2017 1206   UROBILINOGEN 0.2 11/10/2014 1109   NITRITE NEGATIVE 10/08/2017 1206   LEUKOCYTESUR NEGATIVE 10/08/2017 1206   This document was prepared using Dragon Voice Recognition software and may include unintentional dictation errors.  Dr. Sherre [facility] Location: [place]  If 7PM-7AM, please contact overnight-coverage provider If 7AM-7PM, please contact day attending  provider [email or web address]  07/06/2024, 4:39 PM      [1]  Allergies Allergen Reactions   Citalopram Other (See Comments)    FATIGUE    Erythromycin Nausea And Vomiting   Meloxicam  Other (See Comments)    GI UPSET   Propofol Other (See Comments)    TINNITUS   Codeine Nausea And Vomiting   "

## 2024-07-06 NOTE — ED Notes (Signed)
 Patient ambulated steadily and independently to and from restroom.

## 2024-07-06 NOTE — ED Notes (Signed)
 Patient departed [facility] ED with [facility] at this time.

## 2024-07-06 NOTE — Assessment & Plan Note (Signed)
 Home escitalopram  10 mg daily Alprazolam  0.5 mg as needed for anxiety, sleep resumed PDMP reviewed

## 2024-07-06 NOTE — Hospital Course (Addendum)
 Kayla Ware is a 63 year old female with history of anxiety, depression.  12/21: She presents to the ED for chief concerns of dizziness and left-sided facial numbness.  Vitals in the ED showed temperature of 98.6, respiration rate of 13, heart rate 81, blood pressure 117/75, SpO2 100% on room air.  Serum sodium 143, potassium 3.7, chloride 105, bicarb 25, BUN of 8, serum creatinine 0.77, eGFR greater than 60, nonfasting blood glucose 110, WBC 5.8, hemoglobin 13.9, platelets of 237.  CTA head and neck: Was read as no acute intracranial hemorrhage or evidence of acute ischemic change.   MRI brain wo cm: No acute intracranial abnormality.  ED treatment: None.  12/21: Patient admitted to hospitalist service for chief concerns of TIA.

## 2024-07-06 NOTE — ED Notes (Signed)
 Dr. Sherre on ipad video call for remote admission assessment

## 2024-07-06 NOTE — ED Provider Notes (Signed)
 " [facility] Provider Note   CSN: [id number] Arrival date & time: 07/06/24  9064     Patient presents with: Dizziness   Kayla Ware is a 63 y.o. female.   Patient is a 63 year old female who presents with left-sided numbness.  She said about 730 this morning she woke up and when she sat up she got a little dizzy like the room was spinning.  She sat there for a minute and it resolved.  She has not had any further dizziness.  She said she went back to sleep and about 9:00 she woke up.  She said she was normal when she woke up but then she started noticing that the left side of her tongue and the left side of her face were numb.  She also had some numbness in her left arm.  She denied any weakness in her arms or legs.  No facial drooping or slurred speech.  No difficulty getting her words out.  No change in her vision.  She said she is back to normal now without any symptoms.  Her family member at bedside said she has been having some memory issues and has an upcoming appointment with a neurologist in April.       Prior to Admission medications  Medication Sig Start Date End Date Taking? Authorizing Provider  ALPRAZolam  (XANAX ) 1 MG tablet TAKE 1/2 TO 1 (ONE-HALF TO ONE) TABLET BY MOUTH TWICE DAILY AS NEEDED FOR ANXIETY 10/26/18   Collier, Amanda R, PA-C  escitalopram  (LEXAPRO ) 10 MG tablet Take 1 tablet by mouth once daily 10/26/18   Collier, Amanda R, PA-C    Allergies: Citalopram, Erythromycin, Meloxicam , Propofol, and Codeine    Review of Systems  Constitutional:  Negative for chills, diaphoresis, fatigue and fever.  HENT:  Negative for congestion, rhinorrhea and sneezing.   Eyes: Negative.   Respiratory:  Negative for cough, chest tightness and shortness of breath.   Cardiovascular:  Positive for chest pain (Intermittent mild chest pains for a while). Negative for leg swelling.  Gastrointestinal:  Negative for abdominal pain,  diarrhea, nausea and vomiting.  Genitourinary:  Negative for difficulty urinating, flank pain and hematuria.  Musculoskeletal:  Negative for arthralgias and back pain.  Skin:  Negative for rash.  Neurological:  Positive for dizziness, numbness and headaches (She has had intermittent pains in her left temple area for a while denies any pain now). Negative for speech difficulty and weakness.    Updated Vital Signs BP 119/83 (BP Location: Right Arm)   Pulse 94   Temp 98.6 F (37 C) (Oral)   Resp 18   SpO2 98%   Physical Exam Constitutional:      Appearance: She is well-developed.  HENT:     Head: Normocephalic and atraumatic.  Eyes:     Extraocular Movements: Extraocular movements intact.     Pupils: Pupils are equal, round, and reactive to light.     Comments: Horizontal nystagmus with on rightward gaze, no vertical or rotational nystagmus  Cardiovascular:     Rate and Rhythm: Normal rate and regular rhythm.     Heart sounds: Normal heart sounds.  Pulmonary:     Effort: Pulmonary effort is normal. No respiratory distress.     Breath sounds: Normal breath sounds. No wheezing or rales.  Chest:     Chest wall: No tenderness.  Abdominal:     General: Bowel sounds are normal.     Palpations: Abdomen is  soft.     Tenderness: There is no abdominal tenderness. There is no guarding or rebound.  Musculoskeletal:        General: Normal range of motion.     Cervical back: Normal range of motion and neck supple.  Lymphadenopathy:     Cervical: No cervical adenopathy.  Skin:    General: Skin is warm and dry.     Findings: No rash.  Neurological:     Mental Status: She is alert and oriented to person, place, and time.     Comments: Motor 5/5 all extremities Sensation grossly intact to LT all extremities Finger to Nose intact, no pronator drift CN II-XII grossly intact Gait normal Visual fields full to confrontation     (all labs ordered are listed, but only abnormal results  are displayed) Labs Reviewed  COMPREHENSIVE METABOLIC PANEL WITH GFR - Abnormal; Notable for the following components:      Result Value   Glucose, Bld 110 (*)    All other components within normal limits  CBG MONITORING, ED - Abnormal; Notable for the following components:   Glucose-Capillary 121 (*)    All other components within normal limits  PROTIME-INR  APTT  CBC  DIFFERENTIAL  ETHANOL  CBG MONITORING, ED    EKG: EKG Interpretation Date/Time:  Sunday July 06 2024 09:49:43 EST Ventricular Rate:  82 PR Interval:  153 QRS Duration:  88 QT Interval:  388 QTC Calculation: 454 R Axis:   4  Text Interpretation: Sinus rhythm Low voltage, precordial leads Consider anterior infarct Baseline wander in lead(s) V3 No old tracing to compare Confirmed by Lenor Hollering [phone]) on 07/06/2024 9:53:01 AM  Radiology: CT Angio Head Neck W WO CM Result Date: 07/06/2024 EXAM: CTA Head and Neck with Intravenous Contrast. CT Head without Contrast. CLINICAL HISTORY: Neuro deficit, acute, stroke suspected. TECHNIQUE: Axial CTA images of the head and neck performed with intravenous contrast. MIP reconstructed images were created and reviewed. Axial computed tomography images of the head/brain performed without intravenous contrast. Note: Per PQRS, the description of internal carotid artery percent stenosis, including 0 percent or normal exam, is based on [employer] Symptomatic Carotid Endarterectomy Trial (NASCET) criteria. Dose reduction technique was used including one or more of the following: automated exposure control, adjustment of mA and kV according to patient size, and/or iterative reconstruction. CONTRAST: With; 75mL iohexol  (OMNIPAQUE ) 350 MG/ML injection. COMPARISON: CT of the head dated 07/06/2024. FINDINGS: CT HEAD: BRAIN: No acute intraparenchymal hemorrhage. No mass lesion. No CT evidence for acute territorial infarct. No midline shift or extra-axial collection. VENTRICLES: No  hydrocephalus. ORBITS: The orbits are unremarkable. SINUSES AND MASTOIDS: The paranasal sinuses and mastoid air cells are clear. CTA NECK: COMMON CAROTID ARTERIES: No significant stenosis. No dissection or occlusion. INTERNAL CAROTID ARTERIES: No stenosis by NASCET criteria. No dissection or occlusion. VERTEBRAL ARTERIES: No significant stenosis. No dissection or occlusion. CTA HEAD: ANTERIOR CEREBRAL ARTERIES: No significant stenosis. No occlusion. No aneurysm. MIDDLE CEREBRAL ARTERIES: No significant stenosis. No occlusion. No aneurysm. POSTERIOR CEREBRAL ARTERIES: There is fetal type origin of the right posterior cerebral artery. No significant stenosis. No occlusion. No aneurysm. BASILAR ARTERY: No significant stenosis. No occlusion. No aneurysm. OTHER: SOFT TISSUES: No acute finding. No masses or lymphadenopathy. BONES: No acute osseous abnormality. IMPRESSION: 1. No acute intracranial hemorrhage or evidence of acute ischemic change. 2. No evidence of significant stenosis, aneurysmal dilatation, or dissection involving the arteries of the head and neck. 3. Fetal type origin of the right posterior  cerebral artery. Electronically signed by: Evalene Coho MD 07/06/2024 11:52 AM EST RP Workstation: [id number]   CT HEAD WO CONTRAST Result Date: 07/06/2024 EXAM: CT HEAD WITHOUT CONTRAST 07/06/2024 10:06:00 AM TECHNIQUE: CT of the head was performed without the administration of intravenous contrast. Automated exposure control, iterative reconstruction, and/or weight based adjustment of the mA/kV was utilized to reduce the radiation dose to as low as reasonably achievable. COMPARISON: CTA head/neck February 12, 2017 CLINICAL HISTORY: Neuro deficit, acute, stroke suspected FINDINGS: BRAIN AND VENTRICLES: No acute hemorrhage. No evidence of acute infarct. No hydrocephalus. No extra-axial collection. No mass effect or midline shift. ORBITS: No acute abnormality. SINUSES: No acute abnormality. SOFT TISSUES AND SKULL:  No acute soft tissue abnormality. No skull fracture. IMPRESSION: 1. No acute intracranial abnormality. Electronically signed by: Gilmore Molt 07/06/2024 10:15 AM EST RP Workstation: [id number]     Procedures   Medications Ordered in the ED  iohexol  (OMNIPAQUE ) 350 MG/ML injection 75 mL (75 mLs Intravenous Contrast Given 07/06/24 1118)  LORazepam  (ATIVAN ) injection 1 mg (1 mg Intravenous Given 07/06/24 1204)                                    Medical Decision Making Amount and/or Complexity of Data Reviewed Labs: ordered. Radiology: ordered.  Risk Prescription drug management. Decision regarding hospitalization.   This patient presents to the ED for concern of left side numbness, dizziness, this involves an extensive number of treatment options, and is a complaint that carries with it a high risk of complications and morbidity.  I considered the following differential and admission for this acute, potentially life threatening condition.  The differential diagnosis includes neuropathy, stroke, intracranial hemorrhage, TIA, ACS, MS  MDM:    Patient is a 63 year old who presents with dizziness which she describes as a spinning sensation that only lasted for a few minutes as well as some left-sided facial and arm numbness that started this morning.  Her symptoms have resolved by the time she got to the ED.  She is currently neurologically intact.  Head CT does not show any acute abnormality.  Labs are nonconcerning.  She does drink alcohol about 2-3 times a week but has not ever had any withdrawal symptoms and currently does not have any withdrawal symptoms.  Given her symptoms, I consulted with Dr. Matthews.  We will plan admission to [facility] to further assess for possible TIA.  Will go ahead and order MRI and CTA of the head and neck while she is awaiting a bed.  Discussed with Dr. Sherre who will admit the patient to [facility].    (Labs, imaging, consults)  Labs: I Ordered, and  personally interpreted labs.  The pertinent results include: Electrolytes nonconcerning, alcohol level negative, hemoglobin normal  Imaging Studies ordered: I ordered imaging studies including CT head I independently visualized and interpreted imaging. I agree with the radiologist interpretation  Additional history obtained from family member at bedside.  External records from outside source obtained and reviewed including prior notes  Cardiac Monitoring: The patient was maintained on a cardiac monitor.  If on the cardiac monitor, I personally viewed and interpreted the cardiac monitored which showed an underlying rhythm of: Sinus rhythm  Reevaluation: After the interventions noted above, I reevaluated the patient and found that they have :improved  Social Determinants of Health:  alcohol use  Disposition: Admit to hospital  Co morbidities that complicate the patient  evaluation  Past Medical History:  Diagnosis Date   Anxiety    Depression    Gall stones    Heart murmur    Hyperlipidemia    Unspecified vitamin D  deficiency      Medicines Meds ordered this encounter  Medications   iohexol  (OMNIPAQUE ) 350 MG/ML injection 75 mL   LORazepam  (ATIVAN ) injection 1 mg    I have reviewed the patients home medicines and have made adjustments as needed  Problem List / ED Course: Problem List Items Addressed This Visit   None Visit Diagnoses       Left sided numbness    -  Primary            Disposition: Admit to hospital  Co morbidities that complicate the patient evaluation  Past Medical History:  Diagnosis Date   Anxiety    Depression    Gall stones    Heart murmur    Hyperlipidemia    Unspecified vitamin D  deficiency      Medicines Meds ordered this encounter  Medications   iohexol  (OMNIPAQUE ) 350 MG/ML injection 75 mL   LORazepam  (ATIVAN ) injection 1 mg    I have reviewed the patients home medicines and have made adjustments as needed  Problem List  / ED Course: Problem List Items Addressed This Visit   None Visit Diagnoses       Left sided numbness    -  Primary                Final diagnoses:  Left sided numbness    ED Discharge Orders     None          Lenor Hollering, MD 07/06/24 1207  "

## 2024-07-06 NOTE — ED Notes (Signed)
 Urine sent to lab

## 2024-07-06 NOTE — ED Notes (Signed)
 Report received from [place], [place]. Assuming patient care at this time.

## 2024-07-06 NOTE — ED Notes (Signed)
 [facility] called for transport.

## 2024-07-07 ENCOUNTER — Observation Stay (HOSPITAL_COMMUNITY)

## 2024-07-07 DIAGNOSIS — R2990 Unspecified symptoms and signs involving the nervous system: Secondary | ICD-10-CM

## 2024-07-07 DIAGNOSIS — G459 Transient cerebral ischemic attack, unspecified: Secondary | ICD-10-CM | POA: Diagnosis not present

## 2024-07-07 LAB — ECHOCARDIOGRAM COMPLETE
AR max vel: 2.43 cm2
AV Area VTI: 2.2 cm2
AV Area mean vel: 2.1 cm2
AV Mean grad: 3 mmHg
AV Peak grad: 5.2 mmHg
Ao pk vel: 1.14 m/s
Area-P 1/2: 4.31 cm2
Calc EF: 68.9 %
Height: 62 in
S' Lateral: 2.6 cm
Single Plane A2C EF: 71 %
Single Plane A4C EF: 65.3 %
Weight: 1982.38 [oz_av]

## 2024-07-07 LAB — VITAMIN B12: Vitamin B-12: 410 pg/mL (ref 180–914)

## 2024-07-07 LAB — LIPID PANEL
Cholesterol: 201 mg/dL — ABNORMAL HIGH (ref 0–200)
HDL: 47 mg/dL
LDL Cholesterol: 133 mg/dL — ABNORMAL HIGH (ref 0–99)
Total CHOL/HDL Ratio: 4.3 ratio
Triglycerides: 106 mg/dL
VLDL: 21 mg/dL (ref 0–40)

## 2024-07-07 LAB — FOLATE: Folate: 10 ng/mL

## 2024-07-07 LAB — TSH: TSH: 5.24 u[IU]/mL — ABNORMAL HIGH (ref 0.350–4.500)

## 2024-07-07 LAB — HEMOGLOBIN A1C
Hgb A1c MFr Bld: 5.2 % (ref 4.8–5.6)
Mean Plasma Glucose: 102.54 mg/dL

## 2024-07-07 MED ORDER — THIAMINE HCL 100 MG/ML IJ SOLN
100.0000 mg | Freq: Every day | INTRAMUSCULAR | Status: DC
  Filled 2024-07-07: qty 2

## 2024-07-07 MED ORDER — THIAMINE MONONITRATE 100 MG PO TABS
100.0000 mg | ORAL_TABLET | Freq: Every day | ORAL | Status: DC
  Administered 2024-07-07 – 2024-07-08 (×2): 100 mg via ORAL
  Filled 2024-07-07 (×2): qty 1

## 2024-07-07 MED ORDER — ENOXAPARIN SODIUM 40 MG/0.4ML IJ SOSY
40.0000 mg | PREFILLED_SYRINGE | INTRAMUSCULAR | Status: DC
  Administered 2024-07-07: 40 mg via SUBCUTANEOUS
  Filled 2024-07-07: qty 0.4

## 2024-07-07 MED ORDER — ESCITALOPRAM OXALATE 10 MG PO TABS
15.0000 mg | ORAL_TABLET | Freq: Every day | ORAL | Status: DC
  Administered 2024-07-08: 15 mg via ORAL
  Filled 2024-07-07: qty 2

## 2024-07-07 MED ORDER — LACTATED RINGERS IV SOLN: INTRAVENOUS | Status: DC

## 2024-07-07 MED ORDER — LACTATED RINGERS IV SOLN: INTRAVENOUS | Status: AC

## 2024-07-07 NOTE — Progress Notes (Signed)
 Ok to add lovenox  40mg  qday for DVT px per Dr. Dennise.  Sergio Batch, PharmD, BCIDP, AAHIVP, CPP Infectious Disease Pharmacist 07/07/2024 2:02 PM

## 2024-07-07 NOTE — Plan of Care (Signed)
 " Problem: Education: Goal: Knowledge of disease or condition will improve 07/07/2024 0500 by Jerona Cowboy, RN Outcome: Progressing 07/06/2024 2148 by Jerona Cowboy, RN Outcome: Progressing Goal: Knowledge of secondary prevention will improve (MUST DOCUMENT ALL) 07/07/2024 0500 by Jerona Cowboy, RN Outcome: Progressing 07/06/2024 2148 by Jerona Cowboy, RN Outcome: Progressing Goal: Knowledge of patient specific risk factors will improve (DELETE if not current risk factor) 07/07/2024 0500 by Jerona Cowboy, RN Outcome: Progressing 07/06/2024 2148 by Jerona Cowboy, RN Outcome: Progressing   Problem: Ischemic Stroke/TIA Tissue Perfusion: Goal: Complications of ischemic stroke/TIA will be minimized 07/07/2024 0500 by Jerona Cowboy, RN Outcome: Progressing 07/06/2024 2148 by Jerona Cowboy, RN Outcome: Progressing   Problem: Coping: Goal: Will verbalize positive feelings about self 07/07/2024 0500 by Jerona Cowboy, RN Outcome: Progressing 07/06/2024 2148 by Jerona Cowboy, RN Outcome: Progressing Goal: Will identify appropriate support needs 07/07/2024 0500 by Jerona Cowboy, RN Outcome: Progressing 07/06/2024 2148 by Jerona Cowboy, RN Outcome: Progressing   Problem: Health Behavior/Discharge Planning: Goal: Ability to manage health-related needs will improve 07/07/2024 0500 by Jerona Cowboy, RN Outcome: Progressing 07/06/2024 2148 by Jerona Cowboy, RN Outcome: Progressing Goal: Goals will be collaboratively established with patient/family 07/07/2024 0500 by Jerona Cowboy, RN Outcome: Progressing 07/06/2024 2148 by Jerona Cowboy, RN Outcome: Progressing   Problem: Self-Care: Goal: Ability to participate in self-care as condition permits will improve 07/07/2024 0500 by Jerona Cowboy, RN Outcome: Progressing 07/06/2024 2148 by Jerona Cowboy, RN Outcome: Progressing Goal: Verbalization of feelings and concerns over difficulty with self-care will  improve 07/07/2024 0500 by Jerona Cowboy, RN Outcome: Progressing 07/06/2024 2148 by Jerona Cowboy, RN Outcome: Progressing Goal: Ability to communicate needs accurately will improve 07/07/2024 0500 by Jerona Cowboy, RN Outcome: Progressing 07/06/2024 2148 by Jerona Cowboy, RN Outcome: Progressing   Problem: Nutrition: Goal: Risk of aspiration will decrease 07/07/2024 0500 by Jerona Cowboy, RN Outcome: Progressing 07/06/2024 2148 by Jerona Cowboy, RN Outcome: Progressing Goal: Dietary intake will improve 07/07/2024 0500 by Jerona Cowboy, RN Outcome: Progressing 07/06/2024 2148 by Jerona Cowboy, RN Outcome: Progressing   Problem: Education: Goal: Knowledge of General Education information will improve Description: Including pain rating scale, medication(s)/side effects and non-pharmacologic comfort measures 07/07/2024 0500 by Jerona Cowboy, RN Outcome: Progressing 07/06/2024 2148 by Jerona Cowboy, RN Outcome: Progressing   Problem: Health Behavior/Discharge Planning: Goal: Ability to manage health-related needs will improve 07/07/2024 0500 by Jerona Cowboy, RN Outcome: Progressing 07/06/2024 2148 by Jerona Cowboy, RN Outcome: Progressing   Problem: Clinical Measurements: Goal: Ability to maintain clinical measurements within normal limits will improve 07/07/2024 0500 by Jerona Cowboy, RN Outcome: Progressing 07/06/2024 2148 by Jerona Cowboy, RN Outcome: Progressing Goal: Will remain free from infection 07/07/2024 0500 by Jerona Cowboy, RN Outcome: Progressing 07/06/2024 2148 by Jerona Cowboy, RN Outcome: Progressing Goal: Diagnostic test results will improve 07/07/2024 0500 by Jerona Cowboy, RN Outcome: Progressing 07/06/2024 2148 by Jerona Cowboy, RN Outcome: Progressing Goal: Respiratory complications will improve 07/07/2024 0500 by Jerona Cowboy, RN Outcome: Progressing 07/06/2024 2148 by Jerona Cowboy, RN Outcome: Progressing Goal:  Cardiovascular complication will be avoided 07/07/2024 0500 by Jerona Cowboy, RN Outcome: Progressing 07/06/2024 2148 by Jerona Cowboy, RN Outcome: Progressing   Problem: Activity: Goal: Risk for activity intolerance will decrease 07/07/2024 0500 by Jerona Cowboy, RN Outcome: Progressing 07/06/2024 2148 by Jerona Cowboy, RN Outcome: Progressing   Problem: Nutrition: Goal: Adequate nutrition will be maintained 07/07/2024 0500 by Jerona Cowboy, RN Outcome: Progressing 07/06/2024 2148 by Jerona Cowboy, RN Outcome: Progressing   Problem: Coping: Goal: Level of anxiety will  decrease 07/07/2024 0500 by Jerona Cowboy, RN Outcome: Progressing 07/06/2024 2148 by Jerona Cowboy, RN Outcome: Progressing   Problem: Elimination: Goal: Will not experience complications related to bowel motility 07/07/2024 0500 by Jerona Cowboy, RN Outcome: Progressing 07/06/2024 2148 by Jerona Cowboy, RN Outcome: Progressing Goal: Will not experience complications related to urinary retention 07/07/2024 0500 by Jerona Cowboy, RN Outcome: Progressing 07/06/2024 2148 by Jerona Cowboy, RN Outcome: Progressing   Problem: Pain Managment: Goal: General experience of comfort will improve and/or be controlled 07/07/2024 0500 by Jerona Cowboy, RN Outcome: Progressing 07/06/2024 2148 by Jerona Cowboy, RN Outcome: Progressing   Problem: Safety: Goal: Ability to remain free from injury will improve 07/07/2024 0500 by Jerona Cowboy, RN Outcome: Progressing 07/06/2024 2148 by Jerona Cowboy, RN Outcome: Progressing   Problem: Skin Integrity: Goal: Risk for impaired skin integrity will decrease 07/07/2024 0500 by Jerona Cowboy, RN Outcome: Progressing 07/06/2024 2148 by Jerona Cowboy, RN Outcome: Progressing   "

## 2024-07-07 NOTE — Progress Notes (Signed)
 "                                                                                                                                                                                                                                                                                PROGRESS NOTE     Patient Demographics:    Kayla Ware, is a 63 y.o. female, DOB - 04/12/1961, FMW:[id number]  Outpatient Primary MD for the patient is Tonita Fallow, MD (Inactive)    LOS - 0  Admit date - 07/06/2024    Chief Complaint  Patient presents with   Dizziness       Brief Narrative (HPI from H&P)    63 year old female with history of anxiety, depression. Patient in the ER reported that she woke up a little later than normal today at 7:30 AM.  She got up however felt dizzy and this lasted about 10 seconds.  She then went about taking her dog out and then went back to sleep.   At around 9 AM, she woke up was going about her day when she developed left-sided tongue numbness and left-sided facial numbness and tingling.  She then noticed all the fingers in her left hand 1 at a time becoming numb and tingling.   The entire episode lasted about 20 minutes.  Currently it has resolved.    Subjective:    Kayla Ware today has, No headache, No chest pain, No abdominal pain - No Nausea, No new weakness tingling or numbness, no shortness of breath.  Symptom-free except for depression and sadness   Assessment  & Plan :   Atypical symptoms of left sided tingling numbness lasting 10 to 15 minutes, atypical chest pain.  Admitted for TIA, rule out NSTEMI, so far TIA workup unremarkable, seen by neurology, symptoms have resolved, stable MRI brain, CTA head and neck negative, stable LDL, stable A1c.  Stable on telemetry.  Pending echocardiogram, no tingling numbness or chest pain her main symptom now is feeling weak and dizzy upon standing up, symptoms seems to be stemming from extreme depression, dehydration and  hypotension.  Atypical chest pain.  Ruled out MI, stable EKG, troponin negative, chest pain-free now,  chest pain sounded atypical, monitor echocardiogram as above.  Dehydration with hypotension.  Hydrate.    Anxiety and depression.  Somewhat severe, although not suicidal or homicidal, has a flat affect, says this is her main issue since her husband's accidental death 3 years ago, she appears extremely sad with flat affect, already on Lexapro  10 mg daily, will consult psych, I think this is her main issue wrong with dehydration.  Reported history of dyslipidemia.  Stable LDL without medications.  PCP to monitor       Condition - Fair  Family Communication  : None present  Code Status :  Full  Consults  :  Neuro, Psych  PUD Prophylaxis :     Procedures  :     Echocardiogram pending      Disposition Plan  :    Status is: Observation   DVT Prophylaxis  :    SCD's Start: 07/06/24 1422   Lab Results  Component Value Date   PLT 237 07/06/2024    Diet :  Diet Order             Diet Heart Room service appropriate? Yes; Fluid consistency: Thin  Diet effective now                    Inpatient Medications  Scheduled Meds:   stroke: early stages of recovery book   Does not apply Once   escitalopram   10 mg Oral Daily   thiamine  (VITAMIN B1) injection  100 mg Intravenous Daily   Or   thiamine   100 mg Oral Daily   Continuous Infusions:  lactated ringers  500 mL/hr at 07/07/24 0558   lactated ringers      PRN Meds:.acetaminophen  **OR** acetaminophen  (TYLENOL ) oral liquid 160 mg/5 mL **OR** acetaminophen , ALPRAZolam , senna-docusate  Antibiotics  :    Anti-infectives (From admission, onward)    None         Objective:   Vitals:   07/06/24 1800 07/06/24 2000 07/06/24 2130 07/07/24 0455  BP: 136/73 (!) 111/58 117/67 (!) 99/57  Pulse: 67 63 (!) 51 71  Resp: 19 20 19 20   Temp: 98.6 F (37 C) 98.7 F (37.1 C) 98 F (36.7 C) 97.6 F (36.4 C)   TempSrc: Oral Oral Oral Oral  SpO2: 99% 99% 95% 96%  Weight:      Height:        Wt Readings from Last 3 Encounters:  07/06/24 56.2 kg  05/21/18 51.9 kg  05/09/18 51.1 kg     Intake/Output Summary (Last 24 hours) at 07/07/2024 0815 Last data filed at 07/06/2024 2149 Gross per 24 hour  Intake 340 ml  Output --  Net 340 ml     Physical Exam  Awake Alert, No new F.N deficits, flat affect [place].AT,PERRAL Supple Neck, No JVD,   Symmetrical Chest wall movement, Good air movement bilaterally, CTAB RRR,No Gallops,Rubs or new Murmurs,  +ve B.Sounds, Abd Soft, No tenderness,   No Cyanosis, Clubbing or edema      Data Review:    Recent Labs  Lab 07/06/24 0956  WBC 5.8  HGB 13.9  HCT 42.1  PLT 237  MCV 94.2  MCH 31.1  MCHC 33.0  RDW 12.9  LYMPHSABS 2.1  MONOABS 0.5  EOSABS 0.1  BASOSABS 0.0    Recent Labs  Lab 07/06/24 0945 07/06/24 0956 07/07/24 0352  NA  --  143  --   K  --  3.7  --   CL  --  105  --   CO2  --  25  --   ANIONGAP  --  13  --   GLUCOSE  --  110*  --   BUN  --  8  --   CREATININE  --  0.77  --   AST  --  30  --   ALT  --  12  --   ALKPHOS  --  105  --   BILITOT  --  0.5  --   ALBUMIN  --  4.3  --   INR 0.9  --   --   TSH  --   --  5.240*  HGBA1C  --   --  5.2  CALCIUM   --  9.0  --       Recent Labs  Lab 07/06/24 0945 07/06/24 0956 07/07/24 0352  INR 0.9  --   --   TSH  --   --  5.240*  HGBA1C  --   --  5.2  CALCIUM   --  9.0  --     --------------------------------------------------------------------------------------------------------------- Lab Results  Component Value Date   CHOL 201 (H) 07/07/2024   HDL 47 07/07/2024   LDLCALC 133 (H) 07/07/2024   TRIG 106 07/07/2024   CHOLHDL 4.3 07/07/2024    Lab Results  Component Value Date   HGBA1C 5.2 07/07/2024   Recent Labs    07/07/24 0352  TSH 5.240*   Recent Labs    07/07/24 0352  VITAMINB12 410  FOLATE 10.0    ------------------------------------------------------------------------------------------------------------------ Cardiac Enzymes No results for input(s): CKMB, TROPONINI, MYOGLOBIN in the last 168 hours.  Invalid input(s): CK  Micro Results No results found for this or any previous visit (from the past 240 hours).  Radiology Report MR BRAIN WO CONTRAST Result Date: 07/06/2024 EXAM: MRI BRAIN WITHOUT CONTRAST 07/06/2024 12:40:00 PM TECHNIQUE: Multiplanar multisequence MRI of the head/brain was performed without the administration of intravenous contrast. Study is mildly degraded by patient motion. COMPARISON: None available. CLINICAL HISTORY: Neuro deficit, acute, stroke suspected. Dizziness. Left-sided facial numbness and left hand numbness beginning at 07:30 am today. FINDINGS: BRAIN AND VENTRICLES: No acute infarct. No intracranial hemorrhage. No mass. No midline shift. No hydrocephalus. The sella is unremarkable. Normal flow voids. ORBITS: No acute abnormality. SINUSES AND MASTOIDS: No acute abnormality. BONES AND SOFT TISSUES: Normal marrow signal. No acute soft tissue abnormality. IMPRESSION: 1. No acute intracranial abnormality. 2. Study is mildly degraded by patient motion. Electronically signed by: Lonni Necessary MD 07/06/2024 01:10 PM EST RP Workstation: [id number]   CT Angio Head Neck W WO CM Result Date: 07/06/2024 EXAM: CTA Head and Neck with Intravenous Contrast. CT Head without Contrast. CLINICAL HISTORY: Neuro deficit, acute, stroke suspected. TECHNIQUE: Axial CTA images of the head and neck performed with intravenous contrast. MIP reconstructed images were created and reviewed. Axial computed tomography images of the head/brain performed without intravenous contrast. Note: Per PQRS, the description of internal carotid artery percent stenosis, including 0 percent or normal exam, is based on [employer] Symptomatic Carotid Endarterectomy Trial (NASCET) criteria.  Dose reduction technique was used including one or more of the following: automated exposure control, adjustment of mA and kV according to patient size, and/or iterative reconstruction. CONTRAST: With; 75mL iohexol  (OMNIPAQUE ) 350 MG/ML injection. COMPARISON: CT of the head dated 07/06/2024. FINDINGS: CT HEAD: BRAIN: No acute intraparenchymal hemorrhage. No mass lesion. No CT evidence for acute territorial infarct. No midline shift or extra-axial collection. VENTRICLES: No hydrocephalus. ORBITS: The orbits are unremarkable. SINUSES  AND MASTOIDS: The paranasal sinuses and mastoid air cells are clear. CTA NECK: COMMON CAROTID ARTERIES: No significant stenosis. No dissection or occlusion. INTERNAL CAROTID ARTERIES: No stenosis by NASCET criteria. No dissection or occlusion. VERTEBRAL ARTERIES: No significant stenosis. No dissection or occlusion. CTA HEAD: ANTERIOR CEREBRAL ARTERIES: No significant stenosis. No occlusion. No aneurysm. MIDDLE CEREBRAL ARTERIES: No significant stenosis. No occlusion. No aneurysm. POSTERIOR CEREBRAL ARTERIES: There is fetal type origin of the right posterior cerebral artery. No significant stenosis. No occlusion. No aneurysm. BASILAR ARTERY: No significant stenosis. No occlusion. No aneurysm. OTHER: SOFT TISSUES: No acute finding. No masses or lymphadenopathy. BONES: No acute osseous abnormality. IMPRESSION: 1. No acute intracranial hemorrhage or evidence of acute ischemic change. 2. No evidence of significant stenosis, aneurysmal dilatation, or dissection involving the arteries of the head and neck. 3. Fetal type origin of the right posterior cerebral artery. Electronically signed by: Evalene Coho MD 07/06/2024 11:52 AM EST RP Workstation: [id number]   CT HEAD WO CONTRAST Result Date: 07/06/2024 EXAM: CT HEAD WITHOUT CONTRAST 07/06/2024 10:06:00 AM TECHNIQUE: CT of the head was performed without the administration of intravenous contrast. Automated exposure control, iterative  reconstruction, and/or weight based adjustment of the mA/kV was utilized to reduce the radiation dose to as low as reasonably achievable. COMPARISON: CTA head/neck February 12, 2017 CLINICAL HISTORY: Neuro deficit, acute, stroke suspected FINDINGS: BRAIN AND VENTRICLES: No acute hemorrhage. No evidence of acute infarct. No hydrocephalus. No extra-axial collection. No mass effect or midline shift. ORBITS: No acute abnormality. SINUSES: No acute abnormality. SOFT TISSUES AND SKULL: No acute soft tissue abnormality. No skull fracture. IMPRESSION: 1. No acute intracranial abnormality. Electronically signed by: Gilmore Molt 07/06/2024 10:15 AM EST RP Workstation: [id number]     Signature  -   Lavada Stank M.D on 07/07/2024 at 8:15 AM   -  To page go to [email or web address]   "

## 2024-07-07 NOTE — Evaluation (Signed)
 Physical Therapy Evaluation Patient Details Name: Kayla Ware MRN: [id number] DOB: 1961/03/28 Today's Date: 07/07/2024  History of Present Illness  63y.o. female presented to [facility] with complaints of left sided numbness to her tongue, face and fingers. PMH includes: anxiety and depression. MRI and Head CT angio were negative for acute changes  Clinical Impression  Pt presents for physical therapy evaluation following episode of L facial, tongue and finger numbness and tingling at home which resolved. 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 She is functioning at her baseline and IND for all mobility without use of device. She does not require additional PT follow up at discharge. PT will sign off at this time. Please re-consult if there is a change in medical status.        If plan is discharge home, recommend the following:     Can travel by private vehicle        Equipment Recommendations None recommended by PT  Recommendations for Other Services       Functional Status Assessment       Precautions / Restrictions Precautions Precautions: None Restrictions Weight Bearing Restrictions Per Provider Order: No      Mobility  Bed Mobility Overal bed mobility: Independent                  Transfers Overall transfer level: Independent                      Ambulation/Gait Ambulation/Gait assistance: Independent Gait Distance (Feet): 300 Feet Assistive device: None            Stairs            Wheelchair Mobility     Tilt Bed    Modified Rankin (Stroke Patients Only)       Balance Overall balance assessment: Independent                                           Pertinent Vitals/Pain Pain Assessment Pain Assessment: No/denies pain    Home Living Family/patient expects to be discharged to:: Private residence Living Arrangements: Alone Available Help at Discharge:  Family;Friend(s) Type of Home: House Home Access: Ramped entrance       Home Layout: One level        Prior Function Prior Level of Function : Independent/Modified Independent;Driving;Working/employed             Mobility Comments: IND w/o device ADLs Comments: IND all ADLs, IADLs, works and drives     Extremity/Trunk Assessment   Upper Extremity Assessment Upper Extremity Assessment: Overall WFL for tasks assessed    Lower Extremity Assessment Lower Extremity Assessment: Overall WFL for tasks assessed    Cervical / Trunk Assessment Cervical / Trunk Assessment: Normal  Communication   Communication Communication: No apparent difficulties    Cognition Arousal: Alert Behavior During Therapy: WFL for tasks assessed/performed   PT - Cognitive impairments: No apparent impairments                         Following commands: Intact       Cueing Cueing Techniques: Verbal cues     General Comments      Exercises     Assessment/Plan    PT Assessment Patient does not need any further PT services  PT Problem List         PT Treatment Interventions      PT Goals (Current goals can be found in the Care Plan section)  Acute Rehab PT Goals Patient Stated Goal: go home today PT Goal Formulation: With patient Time For Goal Achievement: 07/14/24 Potential to Achieve Goals: Good    Frequency       Co-evaluation               AM-PAC PT 6 Clicks Mobility  Outcome Measure Help needed turning from your back to your side while in a flat bed without using bedrails?: None Help needed moving from lying on your back to sitting on the side of a flat bed without using bedrails?: None Help needed moving to and from a bed to a chair (including a wheelchair)?: None Help needed standing up from a chair using your arms (e.g., wheelchair or bedside chair)?: None Help needed to walk in hospital room?: None Help needed climbing 3-5 steps with a railing?  : None 6 Click Score: 24    End of Session Equipment Utilized During Treatment: Gait belt Activity Tolerance: Patient tolerated treatment well Patient left: in bed Nurse Communication: Mobility status      Time: 8945-8883 PT Time Calculation (min) (ACUTE ONLY): 22 min   Charges:   PT Evaluation $PT Eval Low Complexity: 1 Low   PT General Charges $$ ACUTE PT VISIT: 1 Visit         Isaiah DEL. Guillermina Shaft, PT, DPT   [employer] 07/07/2024, 11:29 AM

## 2024-07-07 NOTE — Consult Note (Signed)
 [facility] Psychiatric Consult Initial  Patient Name: .Kayla Ware  MRN: [id number]  DOB: 05-20-1961  Consult Order details:  Orders (From admission, onward)     Start     Ordered   07/07/24 0801  IP CONSULT TO PSYCHIATRY       Ordering Provider: Dennise Lavada POUR, MD  Provider:  (Not yet assigned)  Question Answer Comment  Location [facility]   Reason for Consult? Severe depression, on Lexapro , med adjustment.  Thank you      07/07/24 0800             Mode of Visit: In person    Psychiatry Consult Evaluation  Service Date: July 07, 2024 LOS:  LOS: 0 days  Chief Complaint Severe depression, on Lexapro , med adjustment.  Primary Psychiatric Diagnoses  MDD 2.  GAD 3.  R/o PTSD and agoraphobia  Assessment  Kayla Ware is a 63 y.o. female admitted: Medicallyfor 07/06/2024  9:45 AM for dizziness and left-sided facial numbness. She carries the psychiatric diagnoses of MDD and has a past medical history of hyperlipidemia and vitamin D  deficiency.   Her current presentation of persistent and pervasive depression, anhedonia, insomnia, poor energy, poor concentration, and poor appetite is most consistent with worsening depression.  With the symptoms, she meets criteria for MDD.  Patient is also experiencing anxiety in the context of places with multiple people.  Current outpatient psychotropic medications include Lexapro  and Xanax  as needed and historically she has had a good response to these medications.  She was also recently prescribed Pristiq but states that she only took it for 1 day and had GI intolerance so she stopped the medication.  Otherwise, she was compliant with medications prior to admission as evidenced by self-report.   On initial examination, patient is pleasant and answers appropriately to her questions.  Patient does have multiple psychosocial stressors that are exacerbating her symptoms.  I recommended patient to establish care  with an outpatient psychiatrist and therapist since her psychotropic medications are currently being managed by her OB/GYN Dr. Rigoberto.  I discussed that we will increase her Lexapro  from 10 mg to 15 mg to address her symptoms of depression and anxiety with plan to contact Dr. Rigoberto regarding these changes.  I also counseled her on the risks of long-term benzodiazepine use including increased falls, worsening cognitive decline, and respiratory distress.  I will place outpatient resources for psychiatry and therapy in her after visit summary.  She would greatly benefit from having a regular therapist that she can work on coping strategies and reframing techniques regarding her psychosocial stressors with.  Patient does not meet criteria for inpatient psychiatric hospitalization and can be discharged home once medically cleared.  Please see plan below for detailed recommendations.   Diagnoses:  Active Hospital problems: Principal Problem:   Stroke-like symptom Active Problems:   Anxiety   Hyperlipidemia   Chest pain    Plan   ## Psychiatric Medication Recommendations:  -- Increase home Lexapro  from 10 mg to 15 mg daily -- Recommend tapering off Xanax  in outpatient  ## Medical Decision Making Capacity: Not specifically addressed in this encounter  ## Further Work-up:  -- none -- most recent EKG on 12/22 had QtC of 454 -- Pertinent labwork reviewed earlier this admission includes:  -- Elevated TSH, elevated cholesterol and LDL-recommend following up these abnormal laboratory results with PCP   ## Disposition:-- There are no psychiatric contraindications to discharge at this time  ## Behavioral /  Environmental: - No specific recommendations at this time.     ## Safety and Observation Level:  - Based on my clinical evaluation, I estimate the patient to be at low risk of self harm in the current setting. - At this time, we recommend  routine. This decision is based on my review of the  chart including patient's history and current presentation, interview of the patient, mental status examination, and consideration of suicide risk including evaluating suicidal ideation, plan, intent, suicidal or self-harm behaviors, risk factors, and protective factors. This judgment is based on our ability to directly address suicide risk, implement suicide prevention strategies, and develop a safety plan while the patient is in the clinical setting. Please contact our team if there is a concern that risk level has changed.  CSSR Risk Category:C-SSRS RISK CATEGORY: No Risk  Suicide Risk Assessment: Patient has following modifiable risk factors for suicide: under treated depression , social isolation, lack of access to outpatient mental health resources, and current symptoms: anxiety/panic, insomnia, impulsivity, anhedonia, hopelessness, which we are addressing by medication management and providing outpatient resources. Patient has following non-modifiable or demographic risk factors for suicide: early widowhood Patient has the following protective factors against suicide: Supportive family, Supportive friends, Pets in the home, Frustration tolerance, no history of suicide attempts, and no history of NSSIB  Thank you for this consult request. Recommendations have been communicated to the primary team.  We will sign off at this time.   Ismael KATHEE Franco, MD       History of Present Illness  Patient Report:   Patient seen resting in bed, no acute distress.  She was aware that psychiatry was consulted to discuss her medication management.  She states that 3 years ago, her husband died in a car accident and she has been having trouble dealing with grief at times.  She states that she does not like leaving the house and going to crowded places.  She states that she used to enjoy gardening but does not find pleasure in this this much anymore.  She says that she would rather stay in bed.  She states that  her dog is a good support for her.  She reports that she is having difficulty sleeping even with her Xanax .  She reports having low energy most of the days and feels her mind is all over the place.  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  She is unsure if the Lexapro  is doing anything for her.  She was started on Pristiq a few weeks ago but after taking 1 dose, she states that she had nausea so she stopped taking the medication.  She reports being prescribed Xanax  1 mg twice a day as needed but she actually takes the medication as 0.5 mg twice a day for her anxiety.  She recalls that she was started on this medication due to having panic attacks when driving years ago.  She states that her OB/GYN physician Dr. Rigoberto is the one who manages her psychotropic medications.  She denies SI, HI, and AVH.  She contracts for safety.  She denies ever being hospitalized in an inpatient psychiatric facility.  She denies any previous suicide attempts or self injures behavior.  Psych ROS:  Depression: Low mood, insomnia, poor energy, poor concentration, poor appetite Anxiety: Reports anxiety in crowded places Mania (lifetime and current): Not reported  Psychosis: (lifetime and current): Denies   Review of Systems  Constitutional:  Negative for chills and fever.  Respiratory:  Negative for shortness of breath.   Gastrointestinal:  Negative for nausea and vomiting.  Neurological:  Negative for headaches.     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 Legal Hx: Denies Living Situation: Lives alone with her dog Access to weapons/lethal means: Denies   Substance History Alcohol: Drinks 2 drinks twice a week with her friends History of alcohol withdrawal seizures Denies History of DT's Denies Tobacco: Denies Illicit drugs: Denies Prescription drug abuse: Denies Rehab hx: Denies  Exam Findings  Vital Signs:  Temp:  [97.6 F (36.4 C)-98.7 F (37.1 C)] 97.6 F (36.4 C) (12/22 0455) Pulse Rate:  [51-96] 71 (12/22 0455) Resp:  [14-20] 20 (12/22 0455) BP: (98-136)/(57-88) 99/57 (12/22 0455) SpO2:  [86 %-100 %] 96 % (12/22 0455) Weight:  [56.2 kg] 56.2 kg (12/21 1314) Blood pressure (!) 99/57, pulse 71, temperature 97.6 F (36.4 C), temperature source Oral, resp. rate 20, height 5' 2 (1.575 m), weight 56.2 kg, SpO2 96%. Body mass index is 22.66 kg/m.  Physical Exam Vitals reviewed.  Constitutional:      Appearance: Normal appearance.  HENT:     Head: Normocephalic and atraumatic.  Cardiovascular:     Rate and Rhythm: Normal rate.  Pulmonary:     Effort: Pulmonary effort is normal.  Neurological:     General: No focal deficit present.     Mental Status: She is alert.     Mental Status Exam: General Appearance: Fairly Groomed  Orientation:  Full (Time, Place, and Person)  Memory:  Remote;   Fair  Concentration:  Concentration: Good  Recall:  Good  Attention  Good  Eye Contact:  Good  Speech:  Slow  Language:  Good  Volume:  Normal  Mood: fine  Affect:  Congruent  Thought Process:  Coherent and Linear  Thought Content:  Logical  Suicidal Thoughts:  No  Homicidal Thoughts:  No  Judgement:  Fair  Insight:  Fair  Psychomotor Activity:  Normal  Akathisia:  No  Fund of Knowledge:  Good      Assets:  Communication Skills Desire for Improvement Financial Resources/Insurance Housing Leisure Time Resilience Social Support  Cognition:  WNL  ADL's:  Intact  AIMS  (if indicated):        Other History   These have been pulled in through the EMR, reviewed, and updated if appropriate.  Family History:  The patient's family history includes Arthritis in her brother; Birth defects in her maternal grandmother; Cancer in her maternal aunt and maternal grandmother; Colon cancer (age of onset: 31) in her paternal aunt; Colon polyps in her father; Depression in her father; Diabetes in her paternal grandmother; Hyperlipidemia in her father.  Medical History: Past Medical History:  Diagnosis Date   Anxiety    Depression    Gall stones    Heart murmur    Hyperlipidemia    Unspecified vitamin D  deficiency     Surgical History: Past Surgical History:  Procedure Laterality Date   bo surgical history  none       Medications:  Current Medications[1]  Allergies: Allergies[2]  Ismael KATHEE Franco, MD     [1]  Current Facility-Administered Medications:     stroke: early stages of recovery book, , Does not apply, Once, Cox, Amy N, DO   acetaminophen  (TYLENOL ) tablet 650 mg, 650 mg, Oral, Q4H PRN, 650 mg at 07/06/24 2036 **OR** acetaminophen  (TYLENOL ) 160 MG/5ML solution 650 mg, 650 mg, Per Tube, Q4H PRN **OR** acetaminophen  (TYLENOL ) suppository 650 mg, 650 mg, Rectal, Q4H PRN, Cox, Amy N, DO   ALPRAZolam  (XANAX ) tablet 0.5 mg, 0.5 mg, Oral, BID PRN, Cox, Amy N, DO   [START ON 07/08/2024] escitalopram  (LEXAPRO ) tablet 15 mg, 15 mg, Oral, Daily, Pooja Camuso B, MD   lactated ringers  infusion, , Intravenous, Continuous, Dennise Lavada POUR, MD, Last Rate: 75 mL/hr at 07/07/24 0912, Rate Change at 07/07/24 0912   senna-docusate (Senokot-S) tablet 1 tablet, 1 tablet, Oral, QHS PRN, Cox, Amy N, DO   thiamine  (VITAMIN B1) injection 100 mg, 100 mg, Intravenous, Daily **OR** thiamine  (VITAMIN B1) tablet 100 mg, 100 mg, Oral, Daily, Khaliqdina, Salman, MD, 100 mg at 07/07/24 0455 [2]  Allergies Allergen Reactions   Diprivan [Propofol] Other (See Comments)     Tinnitus    Ery-Tab [Erythromycin] Nausea And Vomiting   Mobic  [Meloxicam ] Other (See Comments)    Stomach pain   Codeine Nausea And Vomiting

## 2024-07-07 NOTE — Progress Notes (Signed)
 F/u s/p virtual admission  In short patient is a 63 y.o. female with hx of anxiety, depression, HLD, Vit D deficiency who presents with episode of L sided numbness involving Left face and L hand that resolved in 15 mins. Patient is admitted to TIA evaluation.  Patient on arrival no new complaints and no recurrence of symptoms.    Physical Exam Constitutional:      General: She is not in acute distress.    Appearance: She is not ill-appearing.  HENT:     Head: Normocephalic and atraumatic.     Nose: No congestion or rhinorrhea.  Eyes:     Extraocular Movements: Extraocular movements intact.     Conjunctiva/sclera: Conjunctivae normal.     Pupils: Pupils are equal, round, and reactive to light.  Cardiovascular:     Rate and Rhythm: Normal rate and regular rhythm.  Pulmonary:     Effort: Pulmonary effort is normal.     Breath sounds: Normal breath sounds.  Abdominal:     General: Abdomen is flat. Bowel sounds are normal.     Palpations: Abdomen is soft.  Musculoskeletal:     Right lower leg: No edema.     Left lower leg: No edema.  Skin:    General: Skin is warm and dry.  Neurological:     General: No focal deficit present.     Mental Status: She is alert and oriented to person, place, and time.  Psychiatric:        Mood and Affect: Mood normal.      Patient seen by neuro on arrival  plan is outlined previously in HPI  Now changes at this time.

## 2024-07-07 NOTE — Discharge Instructions (Signed)
 Counseling Resources:  [facility] [address], [address]  [place], [place] [address]        [phone]     [name]  [phone] [address] [name]  [place], [place] [address] [phone]  [facility] [address] [place], [place]  [phone]    [name] [address] [place], [place] [phone]  [facility]:  [phone] N[address] [place]  [facility]:  [address] [place], [place] [phone]  *[name] (under & uninsured) [address]   [place] [phone]    [email or web address]    [facility] [place] -[address]     Phone:  [phone] [facility]-  [address]  [phone]    [facility] [address] [place], [place] [phone] ext [phone]  *[facility]: [address] [place], [place], [place]   [phone]   [facility] (Spanish therapist) [address] [place]  [address] [address] [phone]    [facility]  [phone]  [address],  [place], [place]    [place] [phone] [place]   [facility]  [address] [place], [place]        [phone]  [facility] [place], [address] [place] [phone]):   Mon - Fri from 8 AM - 3 PM  [facility] [address], [address] , [place]: [phone]) 8:30 - 12; 1 - 2:30 Accepts Medicaid   [facility] [employer], [address] [place], [place] [phone]):8:30 - 12; 2 - 3PM  [facility] [employer], [address], [place]; [phone]):   Mon - Fri 8 AM - 5 PM  *[facility] [address] [place]  MWF 12:30 to 3:00 or  call to schedule an appointment [phone]  Specific  Provider options Psychology Today  https://www.psychologytoday.com/us  click on find a therapist  enter your zip code left side and select or tailor a therapist for your specific need.   [facility] Provider Directory [email or web address]  (Medicaid)  Follow all drop down to find a provider  Social Support program [facility] [place] [phone] or photosolver.pl [address], [place], [place] Recovery support and educational  In home counseling [facility] Telephone: [phone]  office in [facility] [email or web address]   private insurance [place] health Choice, [place], [place], [place], [place], [place] Health Choice   24- Hour Availability:  [facility]  [phone] or [phone]  [facility] [phone]  [facility]  [phone]   [facility]  [phone] (after hours)  Therapeutic Alternative/Mobile Crisis   [phone]  [place]  National Suicide Hotline  [phone] MERRILYN)  Call 911 or go to emergency room  [facility]  [phone]);  [place]  [phone]); [place], [place], [place], [place], [place], [place], [place]    Follow with Primary MD Tonita Fallow, MD (Inactive) in 7 days   Get CBC, CMP, Magnesium, 2 view Chest X ray -  checked next visit with your primary MD    Activity: As tolerated with Full fall precautions use walker/cane & assistance as needed  Disposition Home   Diet: Heart Healthy    Special  Instructions: If you have smoked or chewed Tobacco  in the last 2 yrs please stop smoking, stop any regular Alcohol  and or any Recreational drug use.  On your next visit with your primary care physician please Get Medicines reviewed and adjusted.  Please request your Prim.MD to go over all Hospital Tests and Procedure/Radiological  results at the follow up, please get all Hospital records sent to your Prim MD by signing hospital release before you go home.  If you experience worsening of your admission symptoms, develop shortness of breath, life threatening emergency, suicidal or homicidal thoughts you must seek medical attention immediately by calling 911 or calling your MD immediately  if symptoms less severe.  You Must read complete instructions/literature along with all the possible adverse reactions/side effects for all the Medicines you take and that have been prescribed to you. Take any new Medicines after you have completely understood and accpet all the possible adverse reactions/side effects.   Do not drive when taking Pain medications.  Do not take more than prescribed Pain, Sleep and Anxiety Medications  Wear Seat belts while driving.

## 2024-07-07 NOTE — Progress Notes (Signed)
 OT Cancellation Note  Patient Details Name: Kayla Ware MRN: [id number] DOB: [birth year]   Cancelled Treatment:    Reason Eval/Treat Not Completed: OT screened, no needs identified, will sign off  Charlie JONETTA Halsted 07/07/2024, 11:21 AM 07/07/2024  RP, OTR/L  Acute Rehabilitation Services  Office:  [phone]

## 2024-07-07 NOTE — TOC CAGE-AID Note (Signed)
 Transition of Care [facility]) - CAGE-AID Screening   Patient Details  Name: Kayla Ware MRN: [id number] Date of Birth: [birth year]  Transition of [facility]) CM/SW Contact:    Landry DELENA Senters, RN Phone Number: 07/07/2024, 2:40 PM   Clinical Narrative:  Patient reports drinking socially 1-2 times per week, vodka mixed drink. She denies illicit drug use. Patient refuses the need for any inpatient or outpatient counseling resources.   CAGE-AID Screening: Substance Abuse Screening unable to be completed due to: : Patient Refused  Have You Ever Felt You Ought to Cut Down on Your Drinking or Drug Use?: No Have People Annoyed You By Critizing Your Drinking Or Drug Use?: No Have You Felt Bad Or Guilty About Your Drinking Or Drug Use?: No Have You Ever Had a Drink or Used Drugs First Thing In The Morning to Steady Your Nerves or to Get Rid of a Hangover?: No CAGE-AID Score: 0  Substance Abuse Education Offered: Yes

## 2024-07-07 NOTE — Progress Notes (Signed)
 STROKE TEAM PROGRESS NOTE   INTERIM HISTORY/SUBJECTIVE No acute events ovenight.    OBJECTIVE  CBC    Component Value Date/Time   WBC 5.8 07/06/2024 0956   RBC 4.47 07/06/2024 0956   HGB 13.9 07/06/2024 0956   HCT 42.1 07/06/2024 0956   PLT 237 07/06/2024 0956   MCV 94.2 07/06/2024 0956   MCH 31.1 07/06/2024 0956   MCHC 33.0 07/06/2024 0956   RDW 12.9 07/06/2024 0956   LYMPHSABS 2.1 07/06/2024 0956   MONOABS 0.5 07/06/2024 0956   EOSABS 0.1 07/06/2024 0956   BASOSABS 0.0 07/06/2024 0956    BMET    Component Value Date/Time   NA 143 07/06/2024 0956   K 3.7 07/06/2024 0956   CL 105 07/06/2024 0956   CO2 25 07/06/2024 0956   GLUCOSE 110 (H) 07/06/2024 0956   BUN 8 07/06/2024 0956   CREATININE 0.77 07/06/2024 0956   CREATININE 0.69 10/08/2017 1206   CALCIUM  9.0 07/06/2024 0956   GFRNONAA >60 07/06/2024 0956   GFRNONAA 97 10/08/2017 1206    IMAGING past 24 hours ECHOCARDIOGRAM COMPLETE Result Date: 07/07/2024    ECHOCARDIOGRAM REPORT   Patient Name:   Kayla Ware Date of Exam: 07/07/2024 Medical Rec #:  [id number]          Height:       62.0 in Accession #:    [id number]         Weight:       123.9 lb Date of Birth:  June 06, 1961           BSA:          1.560 m Patient Age:    63 years           BP:           91/56 mmHg Patient Gender: F                  HR:           59 bpm. Exam Location:  Inpatient Procedure: 2D Echo, Cardiac Doppler and Color Doppler (Both Spectral and Color            Flow Doppler were utilized during procedure). Indications:    Stroke I63.9  History:        Patient has no prior history of Echocardiogram examinations.                 Signs/Symptoms:Chest Pain.  Sonographer:    Nathanel Devonshire Referring Phys: JACQUELINE PRASHANT K [facility] IMPRESSIONS  1. Left ventricular ejection fraction, by estimation, is 60 to 65%. The left ventricle has normal function. The left ventricle has no regional wall motion abnormalities. Left ventricular diastolic parameters were  normal.  2. Right ventricular systolic function is normal. The right ventricular size is normal. There is normal pulmonary artery systolic pressure.  3. The mitral valve is normal in structure. No evidence of mitral valve regurgitation. No evidence of mitral stenosis.  4. The aortic valve is tricuspid. There is mild calcification of the aortic valve. Aortic valve regurgitation is not visualized. No aortic stenosis is present.  5. The inferior vena cava is normal in size with greater than 50% respiratory variability, suggesting right atrial pressure of 3 mmHg. FINDINGS  Left Ventricle: Left ventricular ejection fraction, by estimation, is 60 to 65%. The left ventricle has normal function. The left ventricle has no regional wall motion abnormalities. The left ventricular internal cavity size was normal in size. There is  no  left ventricular hypertrophy. Left ventricular diastolic parameters were normal. Right Ventricle: The right ventricular size is normal. No increase in right ventricular wall thickness. Right ventricular systolic function is normal. There is normal pulmonary artery systolic pressure. The tricuspid regurgitant velocity is 2.16 m/s, and  with an assumed right atrial pressure of 3 mmHg, the estimated right ventricular systolic pressure is 21.7 mmHg. Left Atrium: Left atrial size was normal in size. Right Atrium: Right atrial size was normal in size. Pericardium: Trivial pericardial effusion is present. The pericardial effusion is circumferential. Mitral Valve: The mitral valve is normal in structure. No evidence of mitral valve regurgitation. No evidence of mitral valve stenosis. Tricuspid Valve: The tricuspid valve is normal in structure. Tricuspid valve regurgitation is trivial. No evidence of tricuspid stenosis. Aortic Valve: The aortic valve is tricuspid. There is mild calcification of the aortic valve. Aortic valve regurgitation is not visualized. No aortic stenosis is present. Aortic valve mean  gradient measures 3.0 mmHg. Aortic valve peak gradient measures 5.2 mmHg. Aortic valve area, by VTI measures 2.20 cm. Pulmonic Valve: The pulmonic valve was normal in structure. Pulmonic valve regurgitation is not visualized. No evidence of pulmonic stenosis. Aorta: The aortic root is normal in size and structure. Venous: The inferior vena cava is normal in size with greater than 50% respiratory variability, suggesting right atrial pressure of 3 mmHg. IAS/Shunts: No atrial level shunt detected by color flow Doppler.  LEFT VENTRICLE PLAX 2D LVIDd:         4.10 cm     Diastology LVIDs:         2.60 cm     LV e' medial:    6.74 cm/s LV PW:         0.70 cm     LV E/e' medial:  11.9 LV IVS:        0.70 cm     LV e' lateral:   9.14 cm/s LVOT diam:     1.90 cm     LV E/e' lateral: 8.8 LV SV:         61 LV SV Index:   39 LVOT Area:     2.84 cm LV IVRT:       95 msec  LV Volumes (MOD) LV vol d, MOD A2C: 48.6 ml LV vol d, MOD A4C: 47.0 ml LV vol s, MOD A2C: 14.1 ml LV vol s, MOD A4C: 16.3 ml LV SV MOD A2C:     34.5 ml LV SV MOD A4C:     47.0 ml LV SV MOD BP:      34.4 ml RIGHT VENTRICLE            IVC RV Basal diam:  2.40 cm    IVC diam: 1.40 cm RV S prime:     9.57 cm/s TAPSE (M-mode): 2.0 cm     PULMONARY VEINS                            Diastolic Velocity: 45.80 cm/s                            S/D Velocity:       1.30                            Systolic Velocity:  60.80 cm/s LEFT ATRIUM  Index        RIGHT ATRIUM          Index LA diam:      2.70 cm 1.73 cm/m   RA Area:     9.52 cm LA Vol (A4C): 22.0 ml 14.11 ml/m  RA Volume:   19.30 ml 12.37 ml/m  AORTIC VALVE AV Area (Vmax):    2.43 cm AV Area (Vmean):   2.10 cm AV Area (VTI):     2.20 cm AV Vmax:           114.00 cm/s AV Vmean:          78.600 cm/s AV VTI:            0.277 m AV Peak Grad:      5.2 mmHg AV Mean Grad:      3.0 mmHg LVOT Vmax:         97.90 cm/s LVOT Vmean:        58.300 cm/s LVOT VTI:          0.215 m LVOT/AV VTI ratio: 0.78  AORTA Ao  Root diam: 2.30 cm MITRAL VALVE               TRICUSPID VALVE MV Area (PHT): 4.31 cm    TR Peak grad:   18.7 mmHg MV Decel Time: 176 msec    TR Vmax:        216.00 cm/s MV E velocity: 80.10 cm/s MV A velocity: 62.10 cm/s  SHUNTS MV E/A ratio:  1.29        Systemic VTI:  0.22 m                            Systemic Diam: 1.90 cm Toribio Fuel MD Electronically signed by Toribio Fuel MD Signature Date/Time: 07/07/2024/2:06:24 PM    Final     Vitals:   07/06/24 2130 07/07/24 0455 07/07/24 1230 07/07/24 1509  BP: 117/67 (!) 99/57 108/88 (!) 86/64  Pulse: (!) 51 71    Resp: 19 20    Temp: 98 F (36.7 C) 97.6 F (36.4 C) 97.8 F (36.6 C) 97.9 F (36.6 C)  TempSrc: Oral Oral Oral Oral  SpO2: 95% 96%    Weight:      Height:         PHYSICAL EXAM General:  Alert, well-nourished, well-developed patient in no acute distress Psych:  Mood and affect appropriate for situation CV: Regular rate and rhythm on monitor Respiratory:  Regular, unlabored respirations on room air GI: Abdomen soft and nontender   NEURO:  Mental Status: AA&Ox3, patient is able to give clear and coherent history Speech/Language: speech is without dysarthria or aphasia.  Naming, repetition, fluency, and comprehension intact.  Cranial Nerves:  II: PERRL. Visual fields full.  III, IV, VI: EOMI. Eyelids elevate symmetrically.  V: Sensation is intact to light touch and symmetrical to face.  VII: Face is symmetrical resting and smiling VIII: hearing intact to voice. IX, X: Palate elevates symmetrically. Phonation is normal.  KP:Dynloizm shrug 5/5. XII: tongue is midline without fasciculations. Motor: 5/5 strength to all muscle groups tested.  Tone: is normal and bulk is normal Sensation- Intact to light touch bilaterally. Extinction absent to light touch to DSS.   Coordination: FTN intact bilaterally, HKS: no ataxia in BLE.No drift.  Gait- deferred     ASSESSMENT/PLAN  Kayla Ware is a 63 y.o. female  with hx of anxiety, depression, HLD,  Vit D deficiency who presents with episode of L sided numbness involving Left face and L hand that resolved in 15 mins. Also reporting few months of forgetfulness, poor sleep and depression.   I suspect the episode of focal L sided symptoms with full spontaneous resolution was probably a TIA.  MRI brain without any acute findings.  Vessel imaging without any LVO or high-grade stenosis.  2D echo with 60 to 65% EF.  I recommend 30-day Zio patch cardiac monitoring to complete TIA workup.  In the meantime, continue dual antiplatelets for 21 days followed by monotherapy with aspirin  alone.  Start atorvastatin 40 mg daily for LDL goal of <70; A1c goal <7, BP goal <130/80.  Patient will need to follow-up with vascular neurology outpatient.  No further inpatient stroke workup indicated at this time.   Thedford Homans, MD Vascular Neurology   To contact Stroke Continuity provider, please refer to [email or web address]. After hours, contact General Neurology

## 2024-07-07 NOTE — TOC Initial Note (Signed)
 Transition of Care [facility]) - Initial/Assessment Note    Patient Details  Name: Kayla Ware MRN: [id number] Date of Birth: 12/23/1960  [facility] of [facility]) CM/SW Contact:    Landry DELENA Senters, RN Phone Number: 07/07/2024, 2:36 PM  Clinical Narrative:                 RR:Dlsjwwz Ragen is a 63 year old female with history of anxiety, depression.  She presents to the ED for chief concerns of dizziness and left-sided facial numbness.  Patient lives alone, reports her husband died a few years ago, she does still have support from her daughter and son that live nearby. She reports one of her children will take her home at d/c.   Patient does not have a PCP, reports her OB/GYN has been prescribing her routine medications. After discussion, patient does plan to establish with PCP. Patient does not want CM to do this, prefers to set up herself after d/c.   Patient manages own medications, has no DME at home. [facility] completed.   CM will continue to follow for potential needs.   Expected Discharge Plan: Home/Self Care Barriers to Discharge: Continued Medical Work up   Patient Goals and CMS Choice            Expected Discharge Plan and Services       Living arrangements for the past 2 months: Single Family Home                                      Prior Living Arrangements/Services Living arrangements for the past 2 months: Single Family Home Lives with:: Self Patient language and need for interpreter reviewed:: Yes Do you feel safe going back to the place where you live?: Yes      Need for Family Participation in Patient Care: Yes (Comment) Care giver support system in place?: Yes (comment)   Criminal Activity/Legal Involvement Pertinent to Current Situation/Hospitalization: No - Comment as needed  Activities of Daily Living      Permission Sought/Granted                  Emotional Assessment Appearance:: Developmentally  appropriate Attitude/Demeanor/Rapport: Engaged Affect (typically observed): Calm Orientation: : Oriented to Self, Oriented to Place, Oriented to  Time, Oriented to Situation Alcohol / Substance Use: Alcohol Use Psych Involvement: Yes (comment)  Admission diagnosis:  Chronic anxiety [F41.9] Left sided numbness [R20.0] Stroke-like symptom [R29.90] Patient Active Problem List   Diagnosis Date Noted   Stroke-like symptom 07/06/2024   Chest pain 07/06/2024   Hepatic cyst 08/21/2017   Stress 12/18/2016   Cognitive complaints 12/18/2016   Prediabetes 03/01/2015   Medication management 03/01/2015   Anxiety    Hyperlipidemia    Vitamin D  deficiency    PCP:  Tonita Fallow, MD (Inactive) Pharmacy:   [facility] [id number] - [place], [place] - [address] [place] [address] Phone: [phone] Fax: [phone]     Social Drivers of Health (SDOH) Social History: SDOH Screenings   Food Insecurity: Low Risk (01/02/2023)   Received from [facility]  Housing: Low Risk (01/02/2023)   Received from [facility]  Transportation Needs: No Transportation Needs (01/02/2023)   Received from [facility]  Utilities: Low Risk (01/02/2023)   Received from [facility]  Tobacco Use: Low Risk (07/06/2024)   SDOH Interventions:     Readmission  Risk Interventions     No data to display

## 2024-07-08 ENCOUNTER — Other Ambulatory Visit (HOSPITAL_COMMUNITY): Payer: Self-pay

## 2024-07-08 DIAGNOSIS — R2990 Unspecified symptoms and signs involving the nervous system: Secondary | ICD-10-CM

## 2024-07-08 MED ORDER — ROSUVASTATIN CALCIUM 20 MG PO TABS
40.0000 mg | ORAL_TABLET | Freq: Every day | ORAL | 0 refills | Status: AC
  Filled 2024-07-08: qty 60, 30d supply, fill #0

## 2024-07-08 MED ORDER — ESCITALOPRAM OXALATE 10 MG PO TABS
15.0000 mg | ORAL_TABLET | Freq: Every day | ORAL | 0 refills | Status: AC
  Filled 2024-07-08: qty 15, 10d supply, fill #0

## 2024-07-08 MED ORDER — ASPIRIN 81 MG PO TBEC
81.0000 mg | DELAYED_RELEASE_TABLET | Freq: Every day | ORAL | 0 refills | Status: AC
  Filled 2024-07-08: qty 30, 30d supply, fill #0

## 2024-07-08 NOTE — TOC Transition Note (Addendum)
 Transition of Care [facility]) - Discharge Note   Patient Details  Name: Kayla Ware MRN: [id number] Date of Birth: [birth year]  Transition [facility] Care [facility]) CM/SW Contact:  Landry DELENA Senters, RN Phone Number: 07/08/2024, 9:14 AM   Clinical Narrative:    Patient will be discharging home today, with adult children providing transportation.   Patient does not have a PCP, but patient reported to CM she prefers to set this up herself and doesn't need resources for assistance. Patient is aware she needs to establish a PCP so she can get a referral for outpatient f/u psychiatry.   No further needs identified by therapy.   No further needs identified by CM.   Final next level of care: Home/Self Care Barriers to Discharge: No Barriers Identified   Patient Goals and CMS Choice            Discharge Placement                       Discharge Plan and Services Additional resources added to the After Visit Summary for                                       Social Drivers of Health (SDOH) Interventions SDOH Screenings   Food Insecurity: Patient Declined (07/07/2024)  Housing: Low Risk (07/07/2024)  Transportation Needs: No Transportation Needs (07/07/2024)  Utilities: Patient Declined (07/07/2024)  Tobacco Use: Low Risk (07/06/2024)     Readmission Risk Interventions     No data to display

## 2024-07-08 NOTE — Discharge Summary (Addendum)
 "                                                                                                                                                                               Discharge summary note.  Kayla Ware FMW:[id number] DOB: 01-14-1961 DOA: 07/06/2024  PCP: Tonita Fallow, MD (Inactive)  Admit date: 07/06/2024  Discharge date: 07/08/2024  Admitted From: Home   Disposition:  Home   Recommendations for Outpatient Follow-up:   Follow up with PCP in 1-2 weeks  PCP Please obtain BMP/CBC, 2 view CXR in 1week,  (see Discharge instructions)   PCP Please follow up on the following pending results: Kindly arrange for close outpatient psych follow-up within the next 7 to 10 days.   Home Health: None   Equipment/Devices: None  Consultations: Psych Discharge Condition: Stable    CODE STATUS: Full    Diet Recommendation: Heart Healthy     Chief Complaint  Patient presents with   Dizziness     Brief history of present illness from the day of admission and additional interim summary    63 year old female with history of anxiety, depression. Patient in the ER reported that she woke up a little later than normal today at 7:30 AM.  She got up however felt dizzy and this lasted about 10 seconds.  She then went about taking her dog out and then went back to sleep.   At around 9 AM, she woke up was going about her day when she developed left-sided tongue numbness and left-sided facial numbness and tingling.  She then noticed all the fingers in her left hand 1 at a time becoming numb and tingling.   The entire episode lasted about 20 minutes.  Currently it has resolved.                                                                   Hospital Course   Atypical symptoms of left sided tingling numbness lasting 10 to 15 minutes, atypical chest pain.  Admitted for TIA, rule out NSTEMI, so far TIA workup unremarkable, seen by neurology, symptoms have resolved, stable MRI brain,  CTA head and neck negative, elevated LDL, stable A1c.  Stable on telemetry, stable echocardiogram no tingling numbness or chest pain her main symptom now is feeling weak and dizzy upon standing up, symptoms seems to be stemming from extreme depression, dehydration and hypotension.  After IV fluids the symptoms have resolved.  Per neurology  patient will be discharged on aspirin , low-dose statin along with 30-day event monitor.  Cardiology informed.   Atypical chest pain.  Ruled out MI, stable EKG, troponin negative, chest pain-free now, chest pain sounded atypical, this has completely resolved stable echocardiogram.   Dehydration with hypotension.  Hydrated with IV fluids and improved.   Anxiety and depression.  Somewhat severe, although not suicidal or homicidal, has a flat affect, says this is her main issue since her husband's accidental death 3 years ago, was seen by psych her Lexapro  dose was increased, continue other home medications, after hydration and talking to psych she feels a whole lot better today, will be discharged on higher than home dose Lexapro , not suicidal homicidal, request PCP to arrange for outpatient psych follow-up within the next 7 to 10 days.   Reported history of dyslipidemia.  Stable LDL without medications.  PCP to monitor    Discharge diagnosis     Principal Problem:   Stroke-like symptom Active Problems:   Chest pain   Anxiety   Hyperlipidemia    Discharge instructions    Discharge Instructions     Discharge instructions   Complete by: As directed    Follow with Primary MD Tonita Fallow, MD (Inactive) in 7 days   Get CBC, CMP, Magnesium, 2 view Chest X ray -  checked next visit with your primary MD    Activity: As tolerated with Full fall precautions use walker/cane & assistance as needed  Disposition Home   Diet: Heart Healthy    Special Instructions: If you have smoked or chewed Tobacco  in the last 2 yrs please stop smoking, stop any  regular Alcohol  and or any Recreational drug use.  On your next visit with your primary care physician please Get Medicines reviewed and adjusted.  Please request your Prim.MD to go over all Hospital Tests and Procedure/Radiological results at the follow up, please get all Hospital records sent to your Prim MD by signing hospital release before you go home.  If you experience worsening of your admission symptoms, develop shortness of breath, life threatening emergency, suicidal or homicidal thoughts you must seek medical attention immediately by calling 911 or calling your MD immediately  if symptoms less severe.  You Must read complete instructions/literature along with all the possible adverse reactions/side effects for all the Medicines you take and that have been prescribed to you. Take any new Medicines after you have completely understood and accpet all the possible adverse reactions/side effects.   Do not drive when taking Pain medications.  Do not take more than prescribed Pain, Sleep and Anxiety Medications  Wear Seat belts while driving.   Increase activity slowly   Complete by: As directed        Discharge Medications   Allergies as of 07/08/2024       Reactions   Diprivan [propofol] Other (See Comments)   Tinnitus    Ery-tab [erythromycin] Nausea And Vomiting   Mobic  [meloxicam ] Other (See Comments)   Stomach pain   Codeine Nausea And Vomiting        Medication List     TAKE these medications    ALPRAZolam  1 MG tablet Commonly known as: XANAX  TAKE 1/2 TO 1 (ONE-HALF TO ONE) TABLET BY MOUTH TWICE DAILY AS NEEDED FOR ANXIETY   aspirin  EC 81 MG tablet Take 1 tablet (81 mg total) by mouth daily.   desvenlafaxine 50 MG 24 hr tablet Commonly known as: PRISTIQ Take 50 mg by mouth daily.  escitalopram  10 MG tablet Commonly known as: LEXAPRO  Take 1.5 tablets (15 mg total) by mouth daily. Start taking on: July 09, 2024 What changed: how much to take    ibuprofen 200 MG tablet Commonly known as: ADVIL Take 400 mg by mouth 2 (two) times daily as needed for headache (pain).   rosuvastatin  20 MG tablet Commonly known as: Crestor  Take 2 tablets (40 mg total) by mouth daily.   VITAMIN D -3 PO Take 1 capsule by mouth daily.         Follow-up Information     Tonita Fallow, MD. Schedule an appointment as soon as possible for a visit in 1 week(s).   Specialty: Internal Medicine Why: And your psychiatrist in 1 to 2 weeks, kindly get referral from your PCP. Contact information: [address] [place] [address] [phone]         [facility]. Schedule an appointment as soon as possible for a visit in 1 month(s).   Contact information: [address]     [address] [place]  [address] [phone]                Major procedures and Radiology Reports - PLEASE review detailed and final reports thoroughly  -        ECHOCARDIOGRAM COMPLETE Result Date: 07/07/2024    ECHOCARDIOGRAM REPORT   Patient Name:   Kayla Ware Date of Exam: 07/07/2024 Medical Rec #:  [id number]          Height:       62.0 in Accession #:    [id number]         Weight:       123.9 lb Date of Birth:  1961/04/18           BSA:          1.560 m Patient Age:    63 years           BP:           91/56 mmHg Patient Gender: F                  HR:           59 bpm. Exam Location:  Inpatient Procedure: 2D Echo, Cardiac Doppler and Color Doppler (Both Spectral and Color            Flow Doppler were utilized during procedure). Indications:    Stroke I63.9  History:        Patient has no prior history of Echocardiogram examinations.                 Signs/Symptoms:Chest Pain.  Sonographer:    Nathanel Devonshire Referring Phys: JACQUELINE Gwenn Teodoro K [facility] IMPRESSIONS  1. Left ventricular ejection fraction, by estimation, is 60 to 65%. The left ventricle has normal function. The left ventricle has no regional wall motion  abnormalities. Left ventricular diastolic parameters were normal.  2. Right ventricular systolic function is normal. The right ventricular size is normal. There is normal pulmonary artery systolic pressure.  3. The mitral valve is normal in structure. No evidence of mitral valve regurgitation. No evidence of mitral stenosis.  4. The aortic valve is tricuspid. There is mild calcification of the aortic valve. Aortic valve regurgitation is not visualized. No aortic stenosis is present.  5. The inferior vena cava is normal in size with greater than 50% respiratory variability, suggesting right atrial pressure of 3 mmHg. FINDINGS  Left Ventricle: Left  ventricular ejection fraction, by estimation, is 60 to 65%. The left ventricle has normal function. The left ventricle has no regional wall motion abnormalities. The left ventricular internal cavity size was normal in size. There is  no left ventricular hypertrophy. Left ventricular diastolic parameters were normal. Right Ventricle: The right ventricular size is normal. No increase in right ventricular wall thickness. Right ventricular systolic function is normal. There is normal pulmonary artery systolic pressure. The tricuspid regurgitant velocity is 2.16 m/s, and  with an assumed right atrial pressure of 3 mmHg, the estimated right ventricular systolic pressure is 21.7 mmHg. Left Atrium: Left atrial size was normal in size. Right Atrium: Right atrial size was normal in size. Pericardium: Trivial pericardial effusion is present. The pericardial effusion is circumferential. Mitral Valve: The mitral valve is normal in structure. No evidence of mitral valve regurgitation. No evidence of mitral valve stenosis. Tricuspid Valve: The tricuspid valve is normal in structure. Tricuspid valve regurgitation is trivial. No evidence of tricuspid stenosis. Aortic Valve: The aortic valve is tricuspid. There is mild calcification of the aortic valve. Aortic valve regurgitation is not  visualized. No aortic stenosis is present. Aortic valve mean gradient measures 3.0 mmHg. Aortic valve peak gradient measures 5.2 mmHg. Aortic valve area, by VTI measures 2.20 cm. Pulmonic Valve: The pulmonic valve was normal in structure. Pulmonic valve regurgitation is not visualized. No evidence of pulmonic stenosis. Aorta: The aortic root is normal in size and structure. Venous: The inferior vena cava is normal in size with greater than 50% respiratory variability, suggesting right atrial pressure of 3 mmHg. IAS/Shunts: No atrial level shunt detected by color flow Doppler.  LEFT VENTRICLE PLAX 2D LVIDd:         4.10 cm     Diastology LVIDs:         2.60 cm     LV e' medial:    6.74 cm/s LV PW:         0.70 cm     LV E/e' medial:  11.9 LV IVS:        0.70 cm     LV e' lateral:   9.14 cm/s LVOT diam:     1.90 cm     LV E/e' lateral: 8.8 LV SV:         61 LV SV Index:   39 LVOT Area:     2.84 cm LV IVRT:       95 msec  LV Volumes (MOD) LV vol d, MOD A2C: 48.6 ml LV vol d, MOD A4C: 47.0 ml LV vol s, MOD A2C: 14.1 ml LV vol s, MOD A4C: 16.3 ml LV SV MOD A2C:     34.5 ml LV SV MOD A4C:     47.0 ml LV SV MOD BP:      34.4 ml RIGHT VENTRICLE            IVC RV Basal diam:  2.40 cm    IVC diam: 1.40 cm RV S prime:     9.57 cm/s TAPSE (M-mode): 2.0 cm     PULMONARY VEINS                            Diastolic Velocity: 45.80 cm/s                            S/D Velocity:       1.30  Systolic Velocity:  60.80 cm/s LEFT ATRIUM           Index        RIGHT ATRIUM          Index LA diam:      2.70 cm 1.73 cm/m   RA Area:     9.52 cm LA Vol (A4C): 22.0 ml 14.11 ml/m  RA Volume:   19.30 ml 12.37 ml/m  AORTIC VALVE AV Area (Vmax):    2.43 cm AV Area (Vmean):   2.10 cm AV Area (VTI):     2.20 cm AV Vmax:           114.00 cm/s AV Vmean:          78.600 cm/s AV VTI:            0.277 m AV Peak Grad:      5.2 mmHg AV Mean Grad:      3.0 mmHg LVOT Vmax:         97.90 cm/s LVOT Vmean:        58.300 cm/s  LVOT VTI:          0.215 m LVOT/AV VTI ratio: 0.78  AORTA Ao Root diam: 2.30 cm MITRAL VALVE               TRICUSPID VALVE MV Area (PHT): 4.31 cm    TR Peak grad:   18.7 mmHg MV Decel Time: 176 msec    TR Vmax:        216.00 cm/s MV E velocity: 80.10 cm/s MV A velocity: 62.10 cm/s  SHUNTS MV E/A ratio:  1.29        Systemic VTI:  0.22 m                            Systemic Diam: 1.90 cm Toribio Fuel MD Electronically signed by Toribio Fuel MD Signature Date/Time: 07/07/2024/2:06:24 PM    Final    MR BRAIN WO CONTRAST Result Date: 07/06/2024 EXAM: MRI BRAIN WITHOUT CONTRAST 07/06/2024 12:40:00 PM TECHNIQUE: Multiplanar multisequence MRI of the head/brain was performed without the administration of intravenous contrast. Study is mildly degraded by patient motion. COMPARISON: None available. CLINICAL HISTORY: Neuro deficit, acute, stroke suspected. Dizziness. Left-sided facial numbness and left hand numbness beginning at 07:30 am today. FINDINGS: BRAIN AND VENTRICLES: No acute infarct. No intracranial hemorrhage. No mass. No midline shift. No hydrocephalus. The sella is unremarkable. Normal flow voids. ORBITS: No acute abnormality. SINUSES AND MASTOIDS: No acute abnormality. BONES AND SOFT TISSUES: Normal marrow signal. No acute soft tissue abnormality. IMPRESSION: 1. No acute intracranial abnormality. 2. Study is mildly degraded by patient motion. Electronically signed by: Lonni Necessary MD 07/06/2024 01:10 PM EST RP Workstation: [id number]   CT Angio Head Neck W WO CM Result Date: 07/06/2024 EXAM: CTA Head and Neck with Intravenous Contrast. CT Head without Contrast. CLINICAL HISTORY: Neuro deficit, acute, stroke suspected. TECHNIQUE: Axial CTA images of the head and neck performed with intravenous contrast. MIP reconstructed images were created and reviewed. Axial computed tomography images of the head/brain performed without intravenous contrast. Note: Per PQRS, the description of internal  carotid artery percent stenosis, including 0 percent or normal exam, is based on [employer] American Symptomatic Carotid Endarterectomy Trial (NASCET) criteria. Dose reduction technique was used including one or more of the following: automated exposure control, adjustment of mA and kV according to patient size, and/or iterative reconstruction. CONTRAST: With; 75mL iohexol  (OMNIPAQUE ) 350  MG/ML injection. COMPARISON: CT of the head dated 07/06/2024. FINDINGS: CT HEAD: BRAIN: No acute intraparenchymal hemorrhage. No mass lesion. No CT evidence for acute territorial infarct. No midline shift or extra-axial collection. VENTRICLES: No hydrocephalus. ORBITS: The orbits are unremarkable. SINUSES AND MASTOIDS: The paranasal sinuses and mastoid air cells are clear. CTA NECK: COMMON CAROTID ARTERIES: No significant stenosis. No dissection or occlusion. INTERNAL CAROTID ARTERIES: No stenosis by NASCET criteria. No dissection or occlusion. VERTEBRAL ARTERIES: No significant stenosis. No dissection or occlusion. CTA HEAD: ANTERIOR CEREBRAL ARTERIES: No significant stenosis. No occlusion. No aneurysm. MIDDLE CEREBRAL ARTERIES: No significant stenosis. No occlusion. No aneurysm. POSTERIOR CEREBRAL ARTERIES: There is fetal type origin of the right posterior cerebral artery. No significant stenosis. No occlusion. No aneurysm. BASILAR ARTERY: No significant stenosis. No occlusion. No aneurysm. OTHER: SOFT TISSUES: No acute finding. No masses or lymphadenopathy. BONES: No acute osseous abnormality. IMPRESSION: 1. No acute intracranial hemorrhage or evidence of acute ischemic change. 2. No evidence of significant stenosis, aneurysmal dilatation, or dissection involving the arteries of the head and neck. 3. Fetal type origin of the right posterior cerebral artery. Electronically signed by: Evalene Coho MD 07/06/2024 11:52 AM EST RP Workstation: [id number]   CT HEAD WO CONTRAST Result Date: 07/06/2024 EXAM: CT HEAD WITHOUT CONTRAST  07/06/2024 10:06:00 AM TECHNIQUE: CT of the head was performed without the administration of intravenous contrast. Automated exposure control, iterative reconstruction, and/or weight based adjustment of the mA/kV was utilized to reduce the radiation dose to as low as reasonably achievable. COMPARISON: CTA head/neck February 12, 2017 CLINICAL HISTORY: Neuro deficit, acute, stroke suspected FINDINGS: BRAIN AND VENTRICLES: No acute hemorrhage. No evidence of acute infarct. No hydrocephalus. No extra-axial collection. No mass effect or midline shift. ORBITS: No acute abnormality. SINUSES: No acute abnormality. SOFT TISSUES AND SKULL: No acute soft tissue abnormality. No skull fracture. IMPRESSION: 1. No acute intracranial abnormality. Electronically signed by: Gilmore Molt 07/06/2024 10:15 AM EST RP Workstation: [id number]    Micro Results   No results found for this or any previous visit (from the past 240 hours).  Today   Subjective    Kayla Ware today has no headache,no chest abdominal pain,no new weakness tingling or numbness, feels much better wants to go home today.    Objective   Blood pressure 110/69, pulse 65, temperature 98.8 F (37.1 C), temperature source Oral, resp. rate 19, height 5' 2 (1.575 m), weight 56.2 kg, SpO2 96%.  No intake or output data in the 24 hours ending 07/08/24 1000  Exam  Awake Alert, No new F.N deficits, affect has improved today Calvin.AT,PERRAL Supple Neck,   Symmetrical Chest wall movement, Good air movement bilaterally, CTAB RRR,No Gallops,   +ve B.Sounds, Abd Soft, Non tender,  No Cyanosis, Clubbing or edema    Data Review   Recent Labs  Lab 07/06/24 0956  WBC 5.8  HGB 13.9  HCT 42.1  PLT 237  MCV 94.2  MCH 31.1  MCHC 33.0  RDW 12.9  LYMPHSABS 2.1  MONOABS 0.5  EOSABS 0.1  BASOSABS 0.0    Recent Labs  Lab 07/06/24 0945 07/06/24 0956 07/07/24 0352  NA  --  143  --   K  --  3.7  --   CL  --  105  --   CO2  --  25  --    ANIONGAP  --  13  --   GLUCOSE  --  110*  --   BUN  --  8  --  CREATININE  --  0.77  --   AST  --  30  --   ALT  --  12  --   ALKPHOS  --  105  --   BILITOT  --  0.5  --   ALBUMIN  --  4.3  --   INR 0.9  --   --   TSH  --   --  5.240*  HGBA1C  --   --  5.2  CALCIUM   --  9.0  --     Total Time in preparing paper work, data evaluation and todays exam - 35 minutes  Signature  -    Lavada Stank M.D on 07/08/2024 at 10:00 AM   -  To page go to [email or web address]      "

## 2024-07-08 NOTE — Progress Notes (Signed)
 Event monitor ordered for stroke  Dr. Mona to read, will be new patient

## 2024-07-08 NOTE — Plan of Care (Signed)

## 2024-07-08 NOTE — Plan of Care (Signed)
 " Problem: Education: Goal: Knowledge of disease or condition will improve 07/08/2024 0359 by Oneita Crazier, RN Outcome: Progressing 07/08/2024 0358 by Oneita Crazier, RN Outcome: Progressing Goal: Knowledge of secondary prevention will improve (MUST DOCUMENT ALL) 07/08/2024 0359 by Oneita Crazier, RN Outcome: Progressing 07/08/2024 0358 by Oneita Crazier, RN Outcome: Progressing Goal: Knowledge of patient specific risk factors will improve (DELETE if not current risk factor) 07/08/2024 0359 by Oneita Crazier, RN Outcome: Progressing 07/08/2024 0358 by Oneita Crazier, RN Outcome: Progressing   Problem: Ischemic Stroke/TIA Tissue Perfusion: Goal: Complications of ischemic stroke/TIA will be minimized 07/08/2024 0359 by Oneita Crazier, RN Outcome: Progressing 07/08/2024 0358 by Oneita Crazier, RN Outcome: Progressing   Problem: Coping: Goal: Will verbalize positive feelings about self 07/08/2024 0359 by Oneita Crazier, RN Outcome: Progressing 07/08/2024 0358 by Oneita Crazier, RN Outcome: Progressing Goal: Will identify appropriate support needs 07/08/2024 0359 by Oneita Crazier, RN Outcome: Progressing 07/08/2024 0358 by Oneita Crazier, RN Outcome: Progressing   Problem: Health Behavior/Discharge Planning: Goal: Ability to manage health-related needs will improve 07/08/2024 0359 by Oneita Crazier, RN Outcome: Progressing 07/08/2024 0358 by Oneita Crazier, RN Outcome: Progressing Goal: Goals will be collaboratively established with patient/family 07/08/2024 0359 by Oneita Crazier, RN Outcome: Progressing 07/08/2024 0358 by Oneita Crazier, RN Outcome: Progressing   Problem: Self-Care: Goal: Ability to participate in self-care as condition permits will improve 07/08/2024 0359 by Oneita Crazier, RN Outcome: Progressing 07/08/2024 0358 by Oneita Crazier, RN Outcome: Progressing Goal: Verbalization of feelings and concerns over  difficulty with self-care will improve 07/08/2024 0359 by Oneita Crazier, RN Outcome: Progressing 07/08/2024 0358 by Oneita Crazier, RN Outcome: Progressing Goal: Ability to communicate needs accurately will improve 07/08/2024 0359 by Oneita Crazier, RN Outcome: Progressing 07/08/2024 0358 by Oneita Crazier, RN Outcome: Progressing   Problem: Nutrition: Goal: Risk of aspiration will decrease 07/08/2024 0359 by Oneita Crazier, RN Outcome: Progressing 07/08/2024 0358 by Oneita Crazier, RN Outcome: Progressing Goal: Dietary intake will improve 07/08/2024 0359 by Oneita Crazier, RN Outcome: Progressing 07/08/2024 0358 by Oneita Crazier, RN Outcome: Progressing   Problem: Education: Goal: Knowledge of General Education information will improve Description: Including pain rating scale, medication(s)/side effects and non-pharmacologic comfort measures 07/08/2024 0359 by Oneita Crazier, RN Outcome: Progressing 07/08/2024 0358 by Oneita Crazier, RN Outcome: Progressing   Problem: Health Behavior/Discharge Planning: Goal: Ability to manage health-related needs will improve 07/08/2024 0359 by Oneita Crazier, RN Outcome: Progressing 07/08/2024 0358 by Oneita Crazier, RN Outcome: Progressing   Problem: Clinical Measurements: Goal: Ability to maintain clinical measurements within normal limits will improve 07/08/2024 0359 by Oneita Crazier, RN Outcome: Progressing 07/08/2024 0358 by Oneita Crazier, RN Outcome: Progressing Goal: Will remain free from infection 07/08/2024 0359 by Oneita Crazier, RN Outcome: Progressing 07/08/2024 0358 by Oneita Crazier, RN Outcome: Progressing Goal: Diagnostic test results will improve 07/08/2024 0359 by Oneita Crazier, RN Outcome: Progressing 07/08/2024 0358 by Oneita Crazier, RN Outcome: Progressing Goal: Respiratory complications will improve 07/08/2024 0359 by Oneita Crazier, RN Outcome:  Progressing 07/08/2024 0358 by Oneita Crazier, RN Outcome: Progressing Goal: Cardiovascular complication will be avoided 07/08/2024 0359 by Oneita Crazier, RN Outcome: Progressing 07/08/2024 0358 by Oneita Crazier, RN Outcome: Progressing   Problem: Activity: Goal: Risk for activity intolerance will decrease 07/08/2024 0359 by Oneita Crazier, RN Outcome: Progressing 07/08/2024 0358 by Oneita Crazier, RN Outcome: Progressing   Problem: Nutrition: Goal: Adequate nutrition will be maintained 07/08/2024 0359 by Oneita Crazier, RN Outcome: Progressing 07/08/2024 0358 by Oneita Crazier, RN Outcome: Progressing   Problem: Coping: Goal: Level of anxiety will  decrease 07/08/2024 0359 by Oneita Crazier, RN Outcome: Progressing 07/08/2024 0358 by Oneita Crazier, RN Outcome: Progressing   Problem: Elimination: Goal: Will not experience complications related to bowel motility 07/08/2024 0359 by Oneita Crazier, RN Outcome: Progressing 07/08/2024 0358 by Oneita Crazier, RN Outcome: Progressing Goal: Will not experience complications related to urinary retention 07/08/2024 0359 by Oneita Crazier, RN Outcome: Progressing 07/08/2024 0358 by Oneita Crazier, RN Outcome: Progressing   Problem: Pain Managment: Goal: General experience of comfort will improve and/or be controlled 07/08/2024 0359 by Oneita Crazier, RN Outcome: Progressing 07/08/2024 0358 by Oneita Crazier, RN Outcome: Progressing   Problem: Safety: Goal: Ability to remain free from injury will improve 07/08/2024 0359 by Oneita Crazier, RN Outcome: Progressing 07/08/2024 0358 by Oneita Crazier, RN Outcome: Progressing   Problem: Skin Integrity: Goal: Risk for impaired skin integrity will decrease 07/08/2024 0359 by Oneita Crazier, RN Outcome: Progressing 07/08/2024 0358 by Oneita Crazier, RN Outcome: Progressing   "

## 2024-07-09 ENCOUNTER — Other Ambulatory Visit: Payer: Self-pay | Admitting: Physician Assistant

## 2024-07-09 ENCOUNTER — Encounter: Payer: Self-pay | Admitting: *Deleted

## 2024-07-09 DIAGNOSIS — R2990 Unspecified symptoms and signs involving the nervous system: Secondary | ICD-10-CM

## 2024-07-09 DIAGNOSIS — R42 Dizziness and giddiness: Secondary | ICD-10-CM

## 2024-07-09 DIAGNOSIS — R0789 Other chest pain: Secondary | ICD-10-CM

## 2024-07-09 NOTE — Progress Notes (Unsigned)
 Patient enrolled for [employer] to ship a 30 day cardiac event monitor to her address on file. Letter with instructions mailed to patient. Dr. Mona to read.

## 2024-07-10 LAB — VITAMIN B1: Vitamin B1 (Thiamine): 90.6 nmol/L (ref 66.5–200.0)

## 2024-07-22 ENCOUNTER — Telehealth: Payer: Self-pay | Admitting: Internal Medicine

## 2024-07-22 NOTE — Telephone Encounter (Signed)
 Copied from CRM #[id number]. Topic: Appointments - Scheduling Inquiry for Clinic >> Jul 21, 2024  4:34 PM Rea BROCKS wrote: Reason for CRM: Patient is scheduled for a new patient appt on Feb 2nd with PA Green. Patient initially wanted to schedule with PA Saguier but dates are showing April. Patient had an ER visit is wearing a heart monitor and was prescribed medicine. Patient is asking if there's a possibility if she could be seen sooner, if possible?  [phone] (M)

## 2024-08-18 ENCOUNTER — Ambulatory Visit: Admitting: Physician Assistant

## 2024-08-20 ENCOUNTER — Ambulatory Visit

## 2024-08-20 DIAGNOSIS — R42 Dizziness and giddiness: Secondary | ICD-10-CM

## 2024-08-20 DIAGNOSIS — R0789 Other chest pain: Secondary | ICD-10-CM

## 2024-08-20 DIAGNOSIS — R2990 Unspecified symptoms and signs involving the nervous system: Secondary | ICD-10-CM

## 2024-08-22 ENCOUNTER — Ambulatory Visit: Payer: Self-pay | Admitting: Physician Assistant

## 2024-08-22 DIAGNOSIS — R0789 Other chest pain: Secondary | ICD-10-CM

## 2024-08-22 DIAGNOSIS — R2990 Unspecified symptoms and signs involving the nervous system: Secondary | ICD-10-CM

## 2024-08-22 DIAGNOSIS — R42 Dizziness and giddiness: Secondary | ICD-10-CM

## 2024-08-25 ENCOUNTER — Ambulatory Visit: Payer: Self-pay | Admitting: Physician Assistant

## 2024-09-22 ENCOUNTER — Ambulatory Visit: Admitting: Physician Assistant

## 2024-11-05 ENCOUNTER — Ambulatory Visit: Admitting: Neurology
